# Patient Record
Sex: Female | Born: 1990 | Race: White | Hispanic: No | Marital: Single | Smoking: Former smoker
Health system: Southern US, Community
[De-identification: ages and names within clinical notes are randomized; demographics above are authoritative.]

## PROBLEM LIST (undated history)

## (undated) ENCOUNTER — Inpatient Hospital Stay (HOSPITAL_COMMUNITY): Payer: Self-pay

## (undated) DIAGNOSIS — F319 Bipolar disorder, unspecified: Secondary | ICD-10-CM

## (undated) DIAGNOSIS — J45909 Unspecified asthma, uncomplicated: Secondary | ICD-10-CM

## (undated) DIAGNOSIS — F329 Major depressive disorder, single episode, unspecified: Secondary | ICD-10-CM

## (undated) DIAGNOSIS — B999 Unspecified infectious disease: Secondary | ICD-10-CM

## (undated) DIAGNOSIS — Z8759 Personal history of other complications of pregnancy, childbirth and the puerperium: Secondary | ICD-10-CM

## (undated) DIAGNOSIS — F419 Anxiety disorder, unspecified: Secondary | ICD-10-CM

## (undated) DIAGNOSIS — F32A Depression, unspecified: Secondary | ICD-10-CM

## (undated) DIAGNOSIS — O139 Gestational [pregnancy-induced] hypertension without significant proteinuria, unspecified trimester: Secondary | ICD-10-CM

## (undated) HISTORY — PX: WISDOM TOOTH EXTRACTION: SHX21

## (undated) HISTORY — PX: NO PAST SURGERIES: SHX2092

## (undated) HISTORY — DX: Personal history of other complications of pregnancy, childbirth and the puerperium: Z87.59

---
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
## 2011-09-19 LAB — GC/CHLAMYDIA PROBE AMP, URINE
Chlamydia, Swab/Urine, PCR: NEGATIVE
GC Probe Amp, Urine: NEGATIVE

## 2011-09-20 LAB — URINALYSIS, ROUTINE W REFLEX MICROSCOPIC
Bilirubin Urine: NEGATIVE
Glucose, UA: NEGATIVE
Ketones, ur: NEGATIVE
Specific Gravity, Urine: 1.015
pH: 6

## 2011-09-20 LAB — URINE MICROSCOPIC-ADD ON

## 2011-09-29 LAB — GC/CHLAMYDIA PROBE AMP, URINE
Chlamydia, Swab/Urine, PCR: NEGATIVE
GC Probe Amp, Urine: NEGATIVE

## 2011-09-29 LAB — TSH: TSH: 0.807

## 2011-09-29 LAB — T4, FREE: Free T4: 1.24

## 2017-05-26 ENCOUNTER — Encounter: Payer: Self-pay | Admitting: Emergency Medicine

## 2017-05-26 ENCOUNTER — Emergency Department (HOSPITAL_COMMUNITY): Payer: Medicaid Other

## 2017-05-26 ENCOUNTER — Emergency Department (HOSPITAL_COMMUNITY)
Admission: EM | Admit: 2017-05-26 | Discharge: 2017-05-26 | Disposition: A | Discharge status: Home / Self Care | Payer: Medicaid Other | Attending: Emergency Medicine | Admitting: Emergency Medicine

## 2017-05-26 DIAGNOSIS — J029 Acute pharyngitis, unspecified: Secondary | ICD-10-CM | POA: Diagnosis present

## 2017-05-26 DIAGNOSIS — J020 Streptococcal pharyngitis: Secondary | ICD-10-CM | POA: Diagnosis not present

## 2017-05-26 LAB — RAPID STREP SCREEN (MED CTR MEBANE ONLY): Streptococcus, Group A Screen (Direct): POSITIVE — AB

## 2017-05-26 MED ORDER — DEXAMETHASONE 4 MG PO TABS
10.0000 mg | ORAL_TABLET | Freq: Once | ORAL | Status: AC
  Administered 2017-05-26: 10 mg via ORAL
  Filled 2017-05-26: qty 3

## 2017-05-26 MED ORDER — SODIUM CHLORIDE 0.9 % IV BOLUS (SEPSIS)
1000.0000 mL | Freq: Once | INTRAVENOUS | Status: AC
  Administered 2017-05-26: 1000 mL via INTRAVENOUS

## 2017-05-26 MED ORDER — AMOXICILLIN-POT CLAVULANATE 875-125 MG PO TABS
1.0000 | ORAL_TABLET | Freq: Once | ORAL | Status: AC
  Administered 2017-05-26: 1 via ORAL
  Filled 2017-05-26: qty 1

## 2017-05-26 MED ORDER — IOPAMIDOL (ISOVUE-300) INJECTION 61%
INTRAVENOUS | Status: AC
  Administered 2017-05-26: 75 mL via INTRAVENOUS
  Filled 2017-05-26: qty 75

## 2017-05-26 MED ORDER — AMOXICILLIN-POT CLAVULANATE 875-125 MG PO TABS
1.0000 | ORAL_TABLET | Freq: Two times a day (BID) | ORAL | 0 refills | Status: DC
Start: 2017-05-26 — End: 2017-06-07

## 2017-05-26 NOTE — ED Notes (Signed)
EDP at bedside  

## 2017-05-26 NOTE — Discharge Instructions (Signed)
Medications: Augmentin  Treatment: Take Augmentin twice daily for 10 days. Make sure to finish all of this medication. Try to stay hydrated as best he can. You can gargle with warm salt water. You can take Tylenol as prescribed over-the-counter as needed for your pain.  Follow-up: Please return to the emergency department if you develop any new or worsening symptoms such as inability to open your mouth, asymmetry in your throat, palpable masses in her neck, or any other new or concerning symptoms.

## 2017-05-26 NOTE — ED Provider Notes (Signed)
[facility]-EMERGENCY DEPT Provider Note   CSN: [id number] Arrival date & time: 05/26/17  [id number]     History   Chief Complaint Chief Complaint  Patient presents with  . Sore Throat    HPI Megan LeatherwoodJennifer Whitehead is a 26 y.o. female who presents with a three-day history of sore throat and fever. Patient reports she has not been able to eat much or drink because it hurts too much. She endorses a voice change. She has had gagging and vomiting when she does try to eat or drink. She took Tylenol at 6:30 AM this morning, but no other medications taken at home. She denies any cough, nasal congestion, ear pain, chest pain, shortness of breath, abdominal pain, urinary symptoms.  HPI  No past medical history on file.  There are no active problems to display for this patient.   No past surgical history on file.  OB History    No data available       Home Medications    Prior to Admission medications   Medication Sig Start Date End Date Taking? Authorizing Provider  fluconazole (DIFLUCAN) 150 MG tablet Take 1 tablet by mouth daily. 05/23/17  Yes [provider]  amoxicillin-clavulanate (AUGMENTIN) 875-125 MG tablet Take 1 tablet by mouth every 12 (twelve) hours. 05/26/17   Emi HolesLaw, Tymothy Cass M, PA-C    Family History No family history on file.  Social History Social History  Substance Use Topics  . Smoking status: Not on file  . Smokeless tobacco: Not on file  . Alcohol use Not on file     Allergies   Ibuprofen   Review of Systems Review of Systems  Constitutional: Positive for fever. Negative for chills.  HENT: Positive for sore throat and trouble swallowing. Negative for ear pain and facial swelling.   Respiratory: Negative for shortness of breath.   Cardiovascular: Negative for chest pain.  Gastrointestinal: Negative for abdominal pain, nausea and vomiting.  Genitourinary: Negative for dysuria.  Musculoskeletal: Negative for back pain.  Skin: Negative for rash and wound.    Neurological: Negative for headaches.  Psychiatric/Behavioral: The patient is not nervous/anxious.      Physical Exam Updated Vital Signs BP 117/75   Pulse 99   Temp 99.5 F (37.5 C) (Oral)   Resp 17   Ht 5\' 3"  (1.6 m)   Wt 72.6 kg (160 lb)   LMP 04/15/2017   SpO2 97%   BMI 28.34 kg/m   Physical Exam  Constitutional: She appears well-developed and well-nourished. No distress.  Voice change  HENT:  Head: Normocephalic and atraumatic.  Mouth/Throat: No trismus in the jaw. Uvula swelling present. Posterior oropharyngeal edema, posterior oropharyngeal erythema and tonsillar abscesses present. Tonsils are 3+ on the right. Tonsils are 3+ on the left.  Posterior oropharynx is symmetrical, erythematous, erythematous  Eyes: Conjunctivae are normal. Pupils are equal, round, and reactive to light. Right eye exhibits no discharge. Left eye exhibits no discharge. No scleral icterus.  Neck: Normal range of motion. Neck supple. No thyromegaly present.    No masses palpated to anterior neck  Cardiovascular: Normal rate, regular rhythm, normal heart sounds and intact distal pulses.  Exam reveals no gallop and no friction rub.   No murmur heard. Pulmonary/Chest: Effort normal and breath sounds normal. No stridor. No respiratory distress. She has no wheezes. She has no rales.  Abdominal: Soft. Bowel sounds are normal. She exhibits no distension. There is no tenderness. There is no rebound and no guarding.  Musculoskeletal: She exhibits no  edema.  Lymphadenopathy:    She has no cervical adenopathy.  Neurological: She is alert. Coordination normal.  Skin: Skin is warm and dry. No rash noted. She is not diaphoretic. No pallor.  Psychiatric: She has a normal mood and affect.  Nursing note and vitals reviewed.    ED Treatments / Results  Labs (all labs ordered are listed, but only abnormal results are displayed) Labs Reviewed  RAPID STREP SCREEN (NOT AT [facility]) - Abnormal; Notable for the  following:       Result Value   Streptococcus, Group A Screen (Direct) POSITIVE (*)    All other components within normal limits    EKG  EKG Interpretation None       Radiology Ct Soft Tissue Neck W Contrast  Result Date: 05/26/2017 CLINICAL DATA:  Initial evaluation for acute sore throat for 3 days, voice changes, concern for peritonsillar abscess. EXAM: CT NECK WITH CONTRAST TECHNIQUE: Multidetector CT imaging of the neck was performed using the standard protocol following the bolus administration of intravenous contrast. CONTRAST:  <See Chart> ISOVUE-300 IOPAMIDOL (ISOVUE-300) INJECTION 61% COMPARISON:  None. FINDINGS: Pharynx and larynx: Oral cavity within normal limits without mass lesion or loculated fluid collection. No acute abnormality about the dentition. Palatine tonsils are markedly enlarged and hyperenhancing, consistent with acute tonsillitis. Similar changes within the lingual tonsils and adenoidal soft tissues. No discrete tonsillar or peritonsillar abscess. Remainder of the oropharynx within normal limits. Parapharyngeal fat preserved. Nasopharynx within normal limits. Epiglottis normal. Vallecula clear. Retropharyngeal soft tissues normal. Remainder of the hypopharynx and supraglottic larynx normal. True cords symmetric and normal. Subglottic airway clear. Salivary glands: Salivary glands including the parotid and submandibular glands are normal. Thyroid: Thyroid normal. Lymph nodes: Enlarged bilateral level II adenopathy, measuring up to 2.2 cm on the right and 1.6 cm on the left, likely reactive. Prominent 7 mm left retropharyngeal lymph node, also reactive. Vascular: Normal intravascular enhancement seen throughout the neck. Limited intracranial: Unremarkable. Visualized orbits: Visualized globes and orbital soft tissues within normal limits. Mastoids and visualized paranasal sinuses: Scattered mucosal thickening within the ethmoidal air cells. Retention cyst present within the  bilateral maxillary sinuses. Bilateral concha bullosa noted. Trace right mastoid effusion. Left mastoids clear. Middle ear cavities are clear. Skeleton: No acute osseous abnormality. No worrisome lytic or blastic osseous lesions. Upper chest: The visualized upper chest within normal limits. Visualized lungs are clear. IMPRESSION: 1. Findings consistent with acute tonsillitis. No discrete tonsillar or peritonsillar abscess identified. 2. Enlarged bilateral level II adenopathy, likely reactive. Electronically Signed   By: Rise Mu M.D.   On: 05/26/2017 12:34    Procedures Procedures (including critical care time)  Medications Ordered in ED Medications  sodium chloride 0.9 % bolus 1,000 mL (0 mLs Intravenous Stopped 05/26/17 1250)  iopamidol (ISOVUE-300) 61 % injection (75 mLs Intravenous Contrast Given 05/26/17 1205)  dexamethasone (DECADRON) tablet 10 mg (10 mg Oral Given 05/26/17 1254)  amoxicillin-clavulanate (AUGMENTIN) 875-125 MG per tablet 1 tablet (1 tablet Oral Given 05/26/17 1254)     Initial Impression / Assessment and Plan / ED Course  I have reviewed the triage vital signs and the nursing notes.  Pertinent labs & imaging results that were available during my care of the patient were reviewed by me and considered in my medical decision making (see chart for details).     Patient with positive rapid strep screen. CT soft tissue of the neck was ordered because patient's voice change and concern for peritonsillar abscess. No tonsillar or  peritonsillar abscess identified; findings consistent with acute tonsillitis. Patient also has enlarged bilateral level II adenopathy, likely reactive. Will treat patient with Augmentin. Patient given dexamethasone 10 mg in the ED. Patient tolerating fluids prior to discharge Strict return precautions discussed. Patient understands and agrees with plan. Patient's tachycardia improved with fluids in the ED. Suspect dehydration as cause. Patient  vitals stable and discharged in satisfactory condition. Patient also evaluated by Dr. Madilyn Hook who guided the patient's management and agrees with plan.  Final Clinical Impressions(s) / ED Diagnoses   Final diagnoses:  Strep throat    New Prescriptions Discharge Medication List as of 05/26/2017  1:30 PM    START taking these medications   Details  amoxicillin-clavulanate (AUGMENTIN) 875-125 MG tablet Take 1 tablet by mouth every 12 (twelve) hours., Starting Fri 05/26/2017, Print         Mayleigh Tetrault, [place], PA-C 05/26/17 1501    Tilden Fossa, MD 05/27/17 1029

## 2017-05-26 NOTE — ED Notes (Signed)
Patient transported to CT 

## 2017-05-26 NOTE — ED Notes (Signed)
Pts acuity made a "3" due to HR of 130

## 2017-05-26 NOTE — ED Triage Notes (Addendum)
Pt reports sore throat and states she feels lumps in her neck, onset 3 days ago. She reports pain with swallowing.  Pt states she took 1g of Tylenol this morning at 6:30am this morning.

## 2017-06-07 ENCOUNTER — Encounter (HOSPITAL_COMMUNITY): Payer: Self-pay | Admitting: Neurology

## 2017-06-07 ENCOUNTER — Emergency Department (HOSPITAL_COMMUNITY)
Admission: EM | Admit: 2017-06-07 | Discharge: 2017-06-07 | Disposition: A | Discharge status: Home / Self Care | Payer: Medicaid Other | Attending: Emergency Medicine | Admitting: Emergency Medicine

## 2017-06-07 ENCOUNTER — Emergency Department (HOSPITAL_COMMUNITY): Payer: Medicaid Other

## 2017-06-07 DIAGNOSIS — Z79899 Other long term (current) drug therapy: Secondary | ICD-10-CM | POA: Diagnosis not present

## 2017-06-07 DIAGNOSIS — R079 Chest pain, unspecified: Secondary | ICD-10-CM | POA: Insufficient documentation

## 2017-06-07 DIAGNOSIS — R002 Palpitations: Secondary | ICD-10-CM | POA: Diagnosis present

## 2017-06-07 LAB — BASIC METABOLIC PANEL
Anion gap: 8 (ref 5–15)
BUN: 6 mg/dL (ref 6–20)
CO2: 20 mmol/L — ABNORMAL LOW (ref 22–32)
Calcium: 8.9 mg/dL (ref 8.9–10.3)
Chloride: 108 mmol/L (ref 101–111)
Creatinine, Ser: 0.76 mg/dL (ref 0.44–1.00)
GFR calc Af Amer: >60 mL/min (ref >60)
GFR calc non Af Amer: >60 mL/min (ref >60)
Glucose, Bld: 94 mg/dL (ref 65–99)
Potassium: 3.9 mmol/L (ref 3.5–5.1)
Sodium: 136 mmol/L (ref 135–145)

## 2017-06-07 LAB — I-STAT TROPONIN, ED: Troponin i, poc: 0.01 ng/mL (ref 0.00–0.08)

## 2017-06-07 LAB — CBC
HCT: 42 % (ref 36.0–46.0)
Hemoglobin: 14.4 g/dL (ref 12.0–15.0)
MCH: 30.1 pg (ref 26.0–34.0)
MCHC: 34.3 g/dL (ref 30.0–36.0)
MCV: 87.9 fL (ref 78.0–100.0)
Platelets: 274 10*3/uL (ref 150–400)
RBC: 4.78 MIL/uL (ref 3.87–5.11)
RDW: 12.6 % (ref 11.5–15.5)
WBC: 11.4 10*3/uL — ABNORMAL HIGH (ref 4.0–10.5)

## 2017-06-07 LAB — I-STAT BETA HCG BLOOD, ED (MC, WL, AP ONLY): I-stat hCG, quantitative: <5 m[IU]/mL (ref <5)

## 2017-06-07 LAB — D-DIMER, QUANTITATIVE: D-Dimer, Quant: 0.61 ug/mL-FEU — ABNORMAL HIGH (ref 0.00–0.50)

## 2017-06-07 MED ORDER — IOPAMIDOL (ISOVUE-370) INJECTION 76%
INTRAVENOUS | Status: AC
  Administered 2017-06-07: 100 mL
  Filled 2017-06-07: qty 100

## 2017-06-07 MED ORDER — SODIUM CHLORIDE 0.9 % IV BOLUS (SEPSIS)
1000.0000 mL | Freq: Once | INTRAVENOUS | Status: AC
  Administered 2017-06-07: 1000 mL via INTRAVENOUS

## 2017-06-07 NOTE — ED Triage Notes (Signed)
Pt here reporting left sided CP x 3 days. Feels sob, nausea. Also could be pregnant, LMP 05/16/17. Is a x 4. In NAD

## 2017-06-07 NOTE — ED Notes (Signed)
Pt taken to XRay 

## 2017-06-07 NOTE — ED Notes (Signed)
Pt taken to CT.

## 2017-06-07 NOTE — ED Notes (Signed)
PT refusing gown

## 2017-06-07 NOTE — ED Provider Notes (Signed)
[facility]Y DEPT Provider Note    By signing my name below, I, Earmon Phoenix, attest that this documentation has been prepared under the direction and in the presence of Raeford Razor, MD. Electronically Signed: Earmon Phoenix, ED Scribe. 06/07/17. 12:59 PM.    History   Chief Complaint Chief Complaint  Patient presents with  . Chest Pain    The history is provided by the patient and medical records. No language interpreter was used.    [name] is a 26 y.o. female who presents to the Emergency Department complaining of non radiating CP that began three days ago. She reports associated cough and palpitations. She has not taken anything for pain. Moving her upper extremities, changing positions and bending increase the pain. She denies alleviating factors. She denies SOB, nausea, vomiting. She denies any new or different heavy lifting, trauma, injury or fall. She denies h/o DVT or PE. She is on oral contraceptive pills. She denies any recent travel or surgeries.   History reviewed. No pertinent past medical history.  There are no active problems to display for this patient.   History reviewed. No pertinent surgical history.  OB History    No data available       Home Medications    Prior to Admission medications   Medication Sig Start Date End Date Taking? Authorizing Provider  ESTARYLLA 0.25-35 MG-MCG tablet Take 1 tablet by mouth daily. 05/23/17  Yes [provider]    Family History No family history on file.  Social History Social History  Substance Use Topics  . Smoking status: Never Smoker  . Smokeless tobacco: Not on file  . Alcohol use No     Allergies   Sulfa antibiotics and Ibuprofen   Review of Systems Review of Systems All other systems reviewed and are negative for acute change except as noted in the HPI.   Physical Exam Updated Vital Signs BP 113/72 (BP Location: Left Arm)   Pulse 93   Temp 98.2 F (36.8 C)  (Oral)   Resp 16   Ht 5\' 3"  (1.6 m)   Wt 160 lb (72.6 kg)   LMP 05/16/2017   SpO2 97%   BMI 28.34 kg/m   Physical Exam  Constitutional: She is oriented to person, place, and time. She appears well-developed and well-nourished. No distress.  HENT:  Head: Normocephalic and atraumatic.  Eyes: EOM are normal.  Neck: Normal range of motion.  Cardiovascular: Normal rate, regular rhythm and normal heart sounds.   Pulmonary/Chest: Effort normal and breath sounds normal.  Pain not reproducible with ROM or palpation.  Abdominal: Soft. She exhibits no distension. There is no tenderness.  Musculoskeletal: Normal range of motion.  Neurological: She is alert and oriented to person, place, and time.  Skin: Skin is warm and dry.  Psychiatric: She has a normal mood and affect. Judgment normal.  Nursing note and vitals reviewed.    ED Treatments / Results  DIAGNOSTIC STUDIES: Oxygen Saturation is 97% on RA, normal by my interpretation.   COORDINATION OF CARE: 9:14 AM- Will order CXR, labs and informed pt that EKG was normal. Pt verbalizes understanding and agrees to plan.  Medications  sodium chloride 0.9 % bolus 1,000 mL (1,000 mLs Intravenous New Bag/Given 06/07/17 1115)  iopamidol (ISOVUE-370) 76 % injection (100 mLs  Contrast Given 06/07/17 1223)    Labs (all labs ordered are listed, but only abnormal results are displayed) Labs Reviewed  BASIC METABOLIC PANEL - Abnormal; Notable for the following:  Result Value   CO2 20 (*)    All other components within normal limits  CBC - Abnormal; Notable for the following:    WBC 11.4 (*)    All other components within normal limits  D-DIMER, QUANTITATIVE (NOT AT [facility]) - Abnormal; Notable for the following:    D-Dimer, Quant 0.61 (*)    All other components within normal limits  I-STAT TROPOININ, ED  I-STAT BETA HCG BLOOD, ED ([facility], [facility], [facility] ONLY)    EKG  EKG Interpretation  Date/Time:  Wednesday June 07 2017 08:56:11  EDT Ventricular Rate:  106 PR Interval:  112 QRS Duration: 84 QT Interval:  334 QTC Calculation: 443 R Axis:   77 Text Interpretation:  Sinus tachycardia Otherwise normal ECG Confirmed by Juleen ChinaKOHUT  MD, Iokepa Geffre [phone]([id number]) on 06/07/2017 11:03:16 AM       Radiology Dg Chest 2 View  Result Date: 06/07/2017 CLINICAL DATA:  Onset of LEFT-sided chest pain 3 days ago, getting worse, cough EXAM: CHEST  2 VIEW COMPARISON:  09/25/2016 FINDINGS: Normal heart size, mediastinal contours, and pulmonary vascularity. Lungs clear. No pleural effusion or pneumothorax. Bones unremarkable. IMPRESSION: No acute abnormalities. Electronically Signed   By: Ulyses SouthwardMark  Boles M.D.   On: 06/07/2017 09:36   Ct Angio Chest Pe W And/or Wo Contrast  Result Date: 06/07/2017 CLINICAL DATA:  Chest pain for 3 days EXAM: CT ANGIOGRAPHY CHEST WITH CONTRAST TECHNIQUE: Multidetector CT imaging of the chest was performed using the standard protocol during bolus administration of intravenous contrast. Multiplanar CT image reconstructions and MIPs were obtained to evaluate the vascular anatomy. CONTRAST:  100 mL Isovue 370. COMPARISON:  None. FINDINGS: Cardiovascular: The thoracic aorta is within normal limits. The pulmonary artery shows a normal branching pattern without intraluminal filling defect to suggest pulmonary embolism. No significant cardiac enlargement is noted. No coronary calcifications are seen. Mediastinum/Nodes: The thoracic inlet is within normal limits. No hilar or mediastinal adenopathy is identified. The esophagus as visualized is within normal limits. Lungs/Pleura: Lungs are well aerated bilaterally. A tiny nodule is noted in the right middle lobe best seen on image number 47 of series 7. It measures approximately 5 mm in greatest dimension. No other nodules are noted. No focal infiltrate or sizable effusion is seen. Upper Abdomen: No acute upper abdominal abnormality is noted. Musculoskeletal: No acute bony abnormality is seen.  Review of the MIP images confirms the above findings. IMPRESSION: No evidence of pulmonary emboli. Tiny 5 mm nodule in the right middle lobe. No follow-up needed if patient is low-risk. Non-contrast chest CT can be considered in 12 months if patient is high-risk. This recommendation follows the consensus statement: Guidelines for Management of Incidental Pulmonary Nodules Detected on CT Images: From the Fleischner Society 2017; Radiology 2017; 284:228-243. Electronically Signed   By: Alcide CleverMark  Lukens M.D.   On: 06/07/2017 12:44    Procedures Procedures (including critical care time)  Medications Ordered in ED Medications  sodium chloride 0.9 % bolus 1,000 mL (1,000 mLs Intravenous New Bag/Given 06/07/17 1115)  iopamidol (ISOVUE-370) 76 % injection (100 mLs  Contrast Given 06/07/17 1223)     Initial Impression / Assessment and Plan / ED Course  I have reviewed the triage vital signs and the nursing notes.  Pertinent labs & imaging results that were available during my care of the patient were reviewed by me and considered in my medical decision making (see chart for details).     25yf with CP. Doubt acs, pe, dissection or other emergent process.  Final Clinical Impressions(s) / ED Diagnoses   Final diagnoses:  Chest pain, unspecified type    New Prescriptions New Prescriptions   No medications on file    I personally preformed the services scribed in my presence. The recorded information has been reviewed is accurate. Raeford Razor, MD.     Raeford Razor, MD 06/17/17 [phone]

## 2017-06-07 NOTE — ED Notes (Signed)
Registration at the bedside.

## 2017-06-07 NOTE — ED Notes (Signed)
Pt returned from CT °

## 2017-08-02 ENCOUNTER — Emergency Department (HOSPITAL_COMMUNITY)
Admission: EM | Admit: 2017-08-02 | Discharge: 2017-08-02 | Disposition: A | Discharge status: Home / Self Care | Payer: Medicaid Other | Attending: Emergency Medicine | Admitting: Emergency Medicine

## 2017-08-02 ENCOUNTER — Encounter (HOSPITAL_COMMUNITY): Payer: Self-pay

## 2017-08-02 DIAGNOSIS — N898 Other specified noninflammatory disorders of vagina: Secondary | ICD-10-CM | POA: Insufficient documentation

## 2017-08-02 DIAGNOSIS — Z5321 Procedure and treatment not carried out due to patient leaving prior to being seen by health care provider: Secondary | ICD-10-CM | POA: Insufficient documentation

## 2017-08-02 HISTORY — DX: Unspecified asthma, uncomplicated: J45.909

## 2017-08-02 NOTE — ED Triage Notes (Signed)
Pt reports she is pregnant and had some vaginal irritation after intercourse and is concerned she may have a yeast infection but denies any irritation, discharge, odor. PT also endorses nausea with pregnancy. Unsure how far along she is. Has not seen ob.

## 2017-08-02 NOTE — ED Notes (Signed)
Pt name called x3 with no response. 

## 2017-08-02 NOTE — ED Triage Notes (Signed)
This Clinical research associate called Pt name in front lobby. Unable to locate Pt.

## 2017-08-03 ENCOUNTER — Encounter (HOSPITAL_COMMUNITY): Payer: Self-pay | Admitting: *Deleted

## 2017-08-03 ENCOUNTER — Inpatient Hospital Stay (HOSPITAL_COMMUNITY)
Admission: AD | Admit: 2017-08-03 | Discharge: 2017-08-03 | Disposition: A | Discharge status: Home / Self Care | Payer: Medicaid Other | Source: Ambulatory Visit | Attending: Obstetrics & Gynecology | Admitting: Obstetrics & Gynecology

## 2017-08-03 DIAGNOSIS — Z3A01 Less than 8 weeks gestation of pregnancy: Secondary | ICD-10-CM | POA: Diagnosis not present

## 2017-08-03 DIAGNOSIS — Z882 Allergy status to sulfonamides status: Secondary | ICD-10-CM | POA: Diagnosis not present

## 2017-08-03 DIAGNOSIS — O210 Mild hyperemesis gravidarum: Secondary | ICD-10-CM | POA: Diagnosis not present

## 2017-08-03 DIAGNOSIS — R55 Syncope and collapse: Secondary | ICD-10-CM | POA: Insufficient documentation

## 2017-08-03 DIAGNOSIS — Z888 Allergy status to other drugs, medicaments and biological substances status: Secondary | ICD-10-CM | POA: Insufficient documentation

## 2017-08-03 DIAGNOSIS — O219 Vomiting of pregnancy, unspecified: Secondary | ICD-10-CM | POA: Insufficient documentation

## 2017-08-03 DIAGNOSIS — O26891 Other specified pregnancy related conditions, first trimester: Secondary | ICD-10-CM | POA: Diagnosis not present

## 2017-08-03 LAB — COMPREHENSIVE METABOLIC PANEL
ALT: 15 U/L (ref 14–54)
ANION GAP: 9 (ref 5–15)
AST: 16 U/L (ref 15–41)
Albumin: 4.7 g/dL (ref 3.5–5.0)
Alkaline Phosphatase: 66 U/L (ref 38–126)
BILIRUBIN TOTAL: 1 mg/dL (ref 0.3–1.2)
BUN: 6 mg/dL (ref 6–20)
CO2: 25 mmol/L (ref 22–32)
Calcium: 9.4 mg/dL (ref 8.9–10.3)
Chloride: 103 mmol/L (ref 101–111)
Creatinine, Ser: 0.53 mg/dL (ref 0.44–1.00)
Glucose, Bld: 88 mg/dL (ref 65–99)
POTASSIUM: 3.9 mmol/L (ref 3.5–5.1)
Sodium: 137 mmol/L (ref 135–145)
TOTAL PROTEIN: 8.4 g/dL — AB (ref 6.5–8.1)

## 2017-08-03 LAB — URINALYSIS, ROUTINE W REFLEX MICROSCOPIC
BILIRUBIN URINE: NEGATIVE
GLUCOSE, UA: NEGATIVE mg/dL
HGB URINE DIPSTICK: NEGATIVE
Ketones, ur: NEGATIVE mg/dL
NITRITE: NEGATIVE
Protein, ur: NEGATIVE mg/dL
SPECIFIC GRAVITY, URINE: 1.003 — AB (ref 1.005–1.030)
pH: 7 (ref 5.0–8.0)

## 2017-08-03 LAB — CBC
HCT: 42.2 % (ref 36.0–46.0)
Hemoglobin: 15 g/dL (ref 12.0–15.0)
MCH: 30.4 pg (ref 26.0–34.0)
MCHC: 35.5 g/dL (ref 30.0–36.0)
MCV: 85.4 fL (ref 78.0–100.0)
Platelets: 283 10*3/uL (ref 150–400)
RBC: 4.94 MIL/uL (ref 3.87–5.11)
RDW: 12.9 % (ref 11.5–15.5)
WBC: 9.6 10*3/uL (ref 4.0–10.5)

## 2017-08-03 LAB — POCT PREGNANCY, URINE: Preg Test, Ur: POSITIVE — AB

## 2017-08-03 MED ORDER — PROMETHAZINE HCL 12.5 MG PO TABS: 12.5000 mg | ORAL_TABLET | Freq: Four times a day (QID) | ORAL | 0 refills | Status: DC | PRN

## 2017-08-03 NOTE — MAU Provider Note (Signed)
History  Chief Complaint:  Possible Pregnancy and Fatigue  [name] is a 26 y.o. G4P3 female at [redacted]w[redacted]d presenting via EMS for syncope. She was at a grocery store and felt lightheaded and faint, so she sat down and passed out. She next remembers being handed a large bottle of water. EMS administered fluids via 20G IV and zofran. She states that she feels much better and desires discharge. Prior to the syncopal event, she reports 2 weeks of nausea/vomiting and inability to tolerate PO intake. She reports that the nausea and vomiting have been worse over the past 2 days, and she has not been drinking water and states that she has been very thirsty. She had similar nausea and vomiting in previous pregnancies with reported syncopal episodes due to dehydration.    She denies weight loss, HA, chest pain, SOB, palpitations, vaginal bleeding or discharge, urinary symptoms. She has no personal or family history of cardiac arrhythmias.  She was seen in [facility] ED for chest pain with unremarkable workup including multiple normal EKGs, CXR, CT angiogram.   Not yet established for prenatal care.  Obstetrical History: OB History    Gravida Para Term Preterm AB Living   1             SAB TAB Ectopic Multiple Live Births                  Past Medical History: Past Medical History:  Diagnosis Date  . Asthma     Past Surgical History: No past surgical history on file.  Social History: Social History   Social History  . Marital status: Single    Spouse name: N/A  . Number of children: N/A  . Years of education: N/A   Social History Main Topics  . Smoking status: Never Smoker  . Smokeless tobacco: Never Used  . Alcohol use No  . Drug use: Unknown  . Sexual activity: Not on file   Other Topics Concern  . Not on file   Social History Narrative  . No narrative on file    Allergies: Allergies  Allergen Reactions  . Sulfa Antibiotics Hives  . Ibuprofen Hives    Prescriptions  Prior to Admission  Medication Sig Dispense Refill Last Dose  . ESTARYLLA 0.25-35 MG-MCG tablet Take 1 tablet by mouth daily.  3 06/07/2017 at Unknown time    Review of Systems  Pertinent pos/neg as indicated in HPI  Physical Exam  Blood pressure 126/63, pulse 91, temperature 97.8 F (36.6 C), temperature source Oral, resp. rate 18, weight 79 kg (174 lb 1.3 oz), last menstrual period 06/12/2017, SpO2 99 %. General appearance: alert, cooperative and no distress Lungs: normal work of breathing Extremities: no edema, capillary refill <3sec, normal skin turgor  MAU Course   MDM CBC/CMP/ UPC MAU observation History and physical examination Patient verbalized agreement and understanding with the assessment and plan.  Labs:  Results for orders placed or performed during the hospital encounter of 08/03/17 (from the past 24 hour(s))  Urinalysis, Routine w reflex microscopic     Status: Abnormal   Collection Time: 08/03/17  2:50 PM  Result Value Ref Range   Color, Urine STRAW (A) YELLOW   APPearance HAZY (A) CLEAR   Specific Gravity, Urine 1.003 (L) 1.005 - 1.030   pH 7.0 5.0 - 8.0   Glucose, UA NEGATIVE NEGATIVE mg/dL   Hgb urine dipstick NEGATIVE NEGATIVE   Bilirubin Urine NEGATIVE NEGATIVE   Ketones, ur NEGATIVE NEGATIVE mg/dL  Protein, ur NEGATIVE NEGATIVE mg/dL   Nitrite NEGATIVE NEGATIVE   Leukocytes, UA LARGE (A) NEGATIVE   RBC / HPF 0-5 0 - 5 RBC/hpf   WBC, UA 0-5 0 - 5 WBC/hpf   Bacteria, UA RARE (A) NONE SEEN   Squamous Epithelial / LPF 6-30 (A) NONE SEEN   Amorphous Crystal PRESENT   Pregnancy, urine POC     Status: Abnormal   Collection Time: 08/03/17  3:36 PM  Result Value Ref Range   Preg Test, Ur POSITIVE (A) NEGATIVE  CBC     Status: None   Collection Time: 08/03/17  5:06 PM  Result Value Ref Range   WBC 9.6 4.0 - 10.5 K/uL   RBC 4.94 3.87 - 5.11 MIL/uL   Hemoglobin 15.0 12.0 - 15.0 g/dL   HCT 03.8 88.2 - 80.0 %   MCV 85.4 78.0 - 100.0 fL   MCH 30.4 26.0  - 34.0 pg   MCHC 35.5 30.0 - 36.0 g/dL   RDW 34.9 17.9 - 15.0 %   Platelets 283 150 - 400 K/uL    Imaging:  None Assessment and Plan   A: [name] is a 26yo G4P3 who presents post-syncopal episode. Her syncope was likely due to dehydration secondary to nausea and vomiting of pregnancy resulting in poor PO intake and fluid loss. CMP and CBC wnl, vital signs and physical exam reassuring for appropriate hydration status. Previous cardiac workup on 6/28 at [facility] reassuring.    P: Appropriate for discharge home.   Syncopal Episode: Patient advised to maintain adequate hydration with water or Pedialyte. Strict return precautions given regarding new or worsening symptoms. Pt instructed to establish with PCP for ongoing care.   Nausea and Vomiting of Pregnancy: Prescribed 12.5mg  PO phenergan. Pt instructed on appropriate diet. Return precautions given regarding new or worsening symptoms.   Patient advised to take prenatal vitamin daily. Patient to establish care with [facility] at [facility] office.   Dannette Barbara, Medical Student 8/23/20185:32 PM  Patient feels much better after IV hydration and 4 mg of zofran given by EMS. CBC and CMP normal; UA shows slight dehydration. Patient stable for discharge.   I confirm that I have verified the information documented in the student's note and that I have also personally reperformed the physical exam and all medical decision making activities. Luna Kitchens CNM

## 2017-08-03 NOTE — Discharge Instructions (Signed)

## 2017-08-03 NOTE — MAU Note (Signed)
Urine in lab 

## 2017-08-03 NOTE — MAU Note (Addendum)
Arrived via EMS. 20G IV noted saline locked in L AC Patient went to grocery story and felt faint. States sat down and states that the next thing she remembers is someone handing her a bottle of water.  +nausea--a little better following zofran in the ems States feels dehydrated  +HPT LMP 7/2

## 2017-08-11 ENCOUNTER — Encounter: Payer: Self-pay | Admitting: Obstetrics & Gynecology

## 2017-08-11 ENCOUNTER — Other Ambulatory Visit (HOSPITAL_COMMUNITY)
Admission: RE | Admit: 2017-08-11 | Discharge: 2017-08-11 | Disposition: A | Discharge status: Home / Self Care | Payer: Medicaid Other | Source: Ambulatory Visit | Attending: Obstetrics & Gynecology | Admitting: Obstetrics & Gynecology

## 2017-08-11 ENCOUNTER — Ambulatory Visit (INDEPENDENT_AMBULATORY_CARE_PROVIDER_SITE_OTHER): Payer: Medicaid Other | Admitting: Obstetrics & Gynecology

## 2017-08-11 VITALS — BP 111/68 | HR 102 | Wt 176.8 lb

## 2017-08-11 DIAGNOSIS — Z3482 Encounter for supervision of other normal pregnancy, second trimester: Secondary | ICD-10-CM

## 2017-08-11 DIAGNOSIS — Z3480 Encounter for supervision of other normal pregnancy, unspecified trimester: Secondary | ICD-10-CM | POA: Diagnosis present

## 2017-08-11 DIAGNOSIS — Z3481 Encounter for supervision of other normal pregnancy, first trimester: Secondary | ICD-10-CM | POA: Insufficient documentation

## 2017-08-11 DIAGNOSIS — O09292 Supervision of pregnancy with other poor reproductive or obstetric history, second trimester: Secondary | ICD-10-CM

## 2017-08-11 DIAGNOSIS — B9689 Other specified bacterial agents as the cause of diseases classified elsewhere: Secondary | ICD-10-CM

## 2017-08-11 DIAGNOSIS — O09299 Supervision of pregnancy with other poor reproductive or obstetric history, unspecified trimester: Secondary | ICD-10-CM

## 2017-08-11 DIAGNOSIS — N760 Acute vaginitis: Secondary | ICD-10-CM

## 2017-08-11 NOTE — Progress Notes (Signed)
Subjective:   [name] is a 26 y.o. [phone] at 31w4dby LMP, early ultrasound in office today being seen today for her first obstetrical visit.  Her obstetrical history is significant for pre-eclampsia during all her pregnancies; normotensive when not pregnant.  Patient does not intend to breast feed. Pregnancy history fully reviewed.  Patient reports no complaints.  HISTORY: Obstetric History   G4   P3   T3   P0   A0   L3    SAB0   TAB0   Ectopic0   Multiple0   Live Births3     # Outcome Date GA Lbr Len/2nd Weight Sex Delivery Anes PTL Lv  4 Current           3 Term 03/13/15 439w0d8 lb 1 oz (3.657 kg) M Vag-Spont EPI  LIV  2 Term 07/04/12 4128w0d lb 11 oz (3.033 kg) F Vag-Spont EPI  LIV  1 Term 10/26/10 38w6w0dlb 7 oz (3.374 kg) F Vag-Spont EPI  LIV     Past Medical History:  Diagnosis Date  . Asthma    History reviewed. No pertinent surgical history. Family History  Problem Relation Age of Onset  . Hypertension Mother   . Hypertension Father    Social History  Substance Use Topics  . Smoking status: Never Smoker  . Smokeless tobacco: Never Used  . Alcohol use No   Allergies  Allergen Reactions  . Sulfa Antibiotics Hives  . Ibuprofen Hives   Current Outpatient Prescriptions on File Prior to Visit  Medication Sig Dispense Refill  . promethazine (PHENERGAN) 12.5 MG tablet Take 1 tablet (12.5 mg total) by mouth every 6 (six) hours as needed for nausea or vomiting. 30 tablet 0   No current facility-administered medications on file prior to visit.      Exam   Vitals:   08/11/17 0938  BP: 111/68  Pulse: (!) 102  Weight: 176 lb 12.8 oz (80.2 kg)   Fetal Heart Rate (bpm): + on u/s  Uterus:     Pelvic Exam: Perineum: no hemorrhoids, normal perineum   Vulva: normal external genitalia, no lesions   Vagina:  normal mucosa, normal discharge   Cervix: no lesions and normal, pap smear done.    Adnexa: normal adnexa and no mass, fullness, tenderness   Bony Pelvis: average  System: General: well-developed, well-nourished female in no acute distress   Breast:  normal appearance, no masses or tenderness   Skin: normal coloration and turgor, no rashes   Neurologic: oriented, normal, negative, normal mood   Extremities: normal strength, tone, and muscle mass, ROM of all joints is normal   HEENT PERRLA, extraocular movement intact and sclera clear, anicteric   Mouth/Teeth mucous membranes moist, pharynx normal without lesions and dental hygiene good   Neck supple and no masses   Cardiovascular: regular rate and rhythm   Respiratory:  no respiratory distress, normal breath sounds   Abdomen: soft, non-tender; bowel sounds normal; no masses,  no organomegaly     Assessment:   Pregnancy: G4P3P1P2162ient Active Problem List   Diagnosis Date Noted  . Supervision of other normal pregnancy, antepartum 08/11/2017  . History of pre-eclampsia in prior pregnancy, currently pregnant 08/11/2017     Plan:  1. Supervision of other normal pregnancy, antepartum Initial labs drawn. Continue prenatal vitamins. Genetic Screening discussed, First trimester screen: ordered. Ultrasound discussed; fetal anatomic survey: to be ordered later. - Culture, OB Urine - Cystic  Fibrosis Mutation 97 - Obstetric Panel, Including HIV - Hemoglobinopathy evaluation - Cytology - PAP - [place] bedside - SMN1 COPY NUMBER ANALYSIS (SMA Carrier Screen) - [place] MFM Fetal Nuchal Translucency; Future  2. History of pre-eclampsia in prior pregnancy, currently pregnant Baseline labs ordered. Unable to be on ASA due to allergy. Will monitor BP. - Comp Met (CMET) - Protein / creatinine ratio, urine  Problem list reviewed and updated. The nature of [place] with multiple MDs and other Advanced Practice Providers was explained to patient; also emphasized that residents, students are part of our team. Routine obstetric precautions  reviewed. Return in about 4 weeks (around 09/08/2017) for OB Visit.     Verita Schneiders, MD, [place] Attending [place], [facility] for [employer], [place]

## 2017-08-11 NOTE — Patient Instructions (Signed)
First Trimester of Pregnancy The first trimester of pregnancy is from week 1 until the end of week 13 (months 1 through 3). A week after a sperm fertilizes an egg, the egg will implant on the wall of the uterus. This embryo will begin to develop into a baby. Genes from you and your partner will form the baby. The female genes will determine whether the baby will be a boy or a girl. At 6-8 weeks, the eyes and face will be formed, and the heartbeat can be seen on ultrasound. At the end of 12 weeks, all the baby's organs will be formed. Now that you are pregnant, you will want to do everything you can to have a healthy baby. Two of the most important things are to get good prenatal care and to follow your health care provider's instructions. Prenatal care is all the medical care you receive before the baby's birth. This care will help prevent, find, and treat any problems during the pregnancy and childbirth. Body changes during your first trimester Your body goes through many changes during pregnancy. The changes vary from woman to woman.  You may gain or lose a couple of pounds at first.  You may feel sick to your stomach (nauseous) and you may throw up (vomit). If the vomiting is uncontrollable, call your health care provider.  You may tire easily.  You may develop headaches that can be relieved by medicines. All medicines should be approved by your health care provider.  You may urinate more often. Painful urination may mean you have a bladder infection.  You may develop heartburn as a result of your pregnancy.  You may develop constipation because certain hormones are causing the muscles that push stool through your intestines to slow down.  You may develop hemorrhoids or swollen veins (varicose veins).  Your breasts may begin to grow larger and become tender. Your nipples may stick out more, and the tissue that surrounds them (areola) may become darker.  Your gums may bleed and may be  sensitive to brushing and flossing.  Dark spots or blotches (chloasma, mask of pregnancy) may develop on your face. This will likely fade after the baby is born.  Your menstrual periods will stop.  You may have a loss of appetite.  You may develop cravings for certain kinds of food.  You may have changes in your emotions from day to day, such as being excited to be pregnant or being concerned that something may go wrong with the pregnancy and baby.  You may have more vivid and strange dreams.  You may have changes in your hair. These can include thickening of your hair, rapid growth, and changes in texture. Some women also have hair loss during or after pregnancy, or hair that feels dry or thin. Your hair will most likely return to normal after your baby is born.  What to expect at prenatal visits During a routine prenatal visit:  You will be weighed to make sure you and the baby are growing normally.  Your blood pressure will be taken.  Your abdomen will be measured to track your baby's growth.  The fetal heartbeat will be listened to between weeks 10 and 14 of your pregnancy.  Test results from any previous visits will be discussed.  Your health care provider may ask you:  How you are feeling.  If you are feeling the baby move.  If you have had any abnormal symptoms, such as leaking fluid, bleeding, severe headaches,   or abdominal cramping.  If you are using any tobacco products, including cigarettes, chewing tobacco, and electronic cigarettes.  If you have any questions.  Other tests that may be performed during your first trimester include:  Blood tests to find your blood type and to check for the presence of any previous infections. The tests will also be used to check for low iron levels (anemia) and protein on red blood cells (Rh antibodies). Depending on your risk factors, or if you previously had diabetes during pregnancy, you may have tests to check for high blood  sugar that affects pregnant women (gestational diabetes).  Urine tests to check for infections, diabetes, or protein in the urine.  An ultrasound to confirm the proper growth and development of the baby.  Fetal screens for spinal cord problems (spina bifida) and Down syndrome.  HIV (human immunodeficiency virus) testing. Routine prenatal testing includes screening for HIV, unless you choose not to have this test.  You may need other tests to make sure you and the baby are doing well.  Follow these instructions at home: Medicines  Follow your health care provider's instructions regarding medicine use. Specific medicines may be either safe or unsafe to take during pregnancy.  Take a prenatal vitamin that contains at least 600 micrograms (mcg) of folic acid.  If you develop constipation, try taking a stool softener if your health care provider approves. Eating and drinking  Eat a balanced diet that includes fresh fruits and vegetables, whole grains, good sources of protein such as meat, eggs, or tofu, and low-fat dairy. Your health care provider will help you determine the amount of weight gain that is right for you.  Avoid raw meat and uncooked cheese. These carry germs that can cause birth defects in the baby.  Eating four or five small meals rather than three large meals a day may help relieve nausea and vomiting. If you start to feel nauseous, eating a few soda crackers can be helpful. Drinking liquids between meals, instead of during meals, also seems to help ease nausea and vomiting.  Limit foods that are high in fat and processed sugars, such as fried and sweet foods.  To prevent constipation: ? Eat foods that are high in fiber, such as fresh fruits and vegetables, whole grains, and beans. ? Drink enough fluid to keep your urine clear or pale yellow. Activity  Exercise only as directed by your health care provider. Most women can continue their usual exercise routine during  pregnancy. Try to exercise for 30 minutes at least 5 days a week. Exercising will help you: ? Control your weight. ? Stay in shape. ? Be prepared for labor and delivery.  Experiencing pain or cramping in the lower abdomen or lower back is a good sign that you should stop exercising. Check with your health care provider before continuing with normal exercises.  Try to avoid standing for long periods of time. Move your legs often if you must stand in one place for a long time.  Avoid heavy lifting.  Wear low-heeled shoes and practice good posture.  You may continue to have sex unless your health care provider tells you not to. Relieving pain and discomfort  Wear a good support bra to relieve breast tenderness.  Take warm sitz baths to soothe any pain or discomfort caused by hemorrhoids. Use hemorrhoid cream if your health care provider approves.  Rest with your legs elevated if you have leg cramps or low back pain.  If you develop   varicose veins in your legs, wear support hose. Elevate your feet for 15 minutes, 3-4 times a day. Limit salt in your diet. Prenatal care  Schedule your prenatal visits by the twelfth week of pregnancy. They are usually scheduled monthly at first, then more often in the last 2 months before delivery.  Write down your questions. Take them to your prenatal visits.  Keep all your prenatal visits as told by your health care provider. This is important. Safety  Wear your seat belt at all times when driving.  Make a list of emergency phone numbers, including numbers for family, friends, the hospital, and police and fire departments. General instructions  Ask your health care provider for a referral to a local prenatal education class. Begin classes no later than the beginning of month 6 of your pregnancy.  Ask for help if you have counseling or nutritional needs during pregnancy. Your health care provider can offer advice or refer you to specialists for help  with various needs.  Do not use hot tubs, steam rooms, or saunas.  Do not douche or use tampons or scented sanitary pads.  Do not cross your legs for long periods of time.  Avoid cat litter boxes and soil used by cats. These carry germs that can cause birth defects in the baby and possibly loss of the fetus by miscarriage or stillbirth.  Avoid all smoking, herbs, alcohol, and medicines not prescribed by your health care provider. Chemicals in these products affect the formation and growth of the baby.  Do not use any products that contain nicotine or tobacco, such as cigarettes and e-cigarettes. If you need help quitting, ask your health care provider. You may receive counseling support and other resources to help you quit.  Schedule a dentist appointment. At home, brush your teeth with a soft toothbrush and be gentle when you floss. Contact a health care provider if:  You have dizziness.  You have mild pelvic cramps, pelvic pressure, or nagging pain in the abdominal area.  You have persistent nausea, vomiting, or diarrhea.  You have a bad smelling vaginal discharge.  You have pain when you urinate.  You notice increased swelling in your face, hands, legs, or ankles.  You are exposed to fifth disease or chickenpox.  You are exposed to German measles (rubella) and have never had it. Get help right away if:  You have a fever.  You are leaking fluid from your vagina.  You have spotting or bleeding from your vagina.  You have severe abdominal cramping or pain.  You have rapid weight gain or loss.  You vomit blood or material that looks like coffee grounds.  You develop a severe headache.  You have shortness of breath.  You have any kind of trauma, such as from a fall or a car accident. Summary  The first trimester of pregnancy is from week 1 until the end of week 13 (months 1 through 3).  Your body goes through many changes during pregnancy. The changes vary from  woman to woman.  You will have routine prenatal visits. During those visits, your health care provider will examine you, discuss any test results you may have, and talk with you about how you are feeling. This information is not intended to replace advice given to you by your health care provider. Make sure you discuss any questions you have with your health care provider. Document Released: 11/22/2001 Document Revised: 11/09/2016 Document Reviewed: 11/09/2016 Elsevier Interactive Patient Education  2017 [employer]  

## 2017-08-14 LAB — CULTURE, OB URINE

## 2017-08-14 LAB — URINE CULTURE, OB REFLEX

## 2017-08-17 LAB — CYTOLOGY - PAP
BACTERIAL VAGINITIS: POSITIVE — AB
CANDIDA VAGINITIS: NEGATIVE
Chlamydia: NEGATIVE
Diagnosis: NEGATIVE
Neisseria Gonorrhea: NEGATIVE
TRICH (WINDOWPATH): NEGATIVE

## 2017-08-18 MED ORDER — METRONIDAZOLE 500 MG PO TABS: 500.0000 mg | ORAL_TABLET | Freq: Two times a day (BID) | ORAL | 0 refills | Status: DC

## 2017-08-18 NOTE — Addendum Note (Signed)
Addended by: Jaynie CollinsANYANWU, Lorie Melichar A on: 08/18/2017 10:44 AM   Modules accepted: Orders

## 2017-08-21 ENCOUNTER — Encounter (INDEPENDENT_AMBULATORY_CARE_PROVIDER_SITE_OTHER): Payer: Medicaid Other | Admitting: *Deleted

## 2017-08-21 DIAGNOSIS — Z3482 Encounter for supervision of other normal pregnancy, second trimester: Secondary | ICD-10-CM

## 2017-08-24 LAB — COMPREHENSIVE METABOLIC PANEL
A/G RATIO: 1.6 (ref 1.2–2.2)
ALT: 13 IU/L (ref 0–32)
AST: 17 IU/L (ref 0–40)
Albumin: 4.2 g/dL (ref 3.5–5.5)
Alkaline Phosphatase: 64 IU/L (ref 39–117)
BUN/Creatinine Ratio: 10 (ref 9–23)
BUN: 7 mg/dL (ref 6–20)
Bilirubin Total: 0.4 mg/dL (ref 0.0–1.2)
CALCIUM: 9.6 mg/dL (ref 8.7–10.2)
CHLORIDE: 102 mmol/L (ref 96–106)
CO2: 23 mmol/L (ref 20–29)
Creatinine, Ser: 0.68 mg/dL (ref 0.57–1.00)
GFR calc Af Amer: 141 mL/min/{1.73_m2} (ref >59)
GFR calc non Af Amer: 122 mL/min/{1.73_m2} (ref >59)
Globulin, Total: 2.7 g/dL (ref 1.5–4.5)
Glucose: 76 mg/dL (ref 65–99)
POTASSIUM: 4.8 mmol/L (ref 3.5–5.2)
Sodium: 138 mmol/L (ref 134–144)
Total Protein: 6.9 g/dL (ref 6.0–8.5)

## 2017-08-24 LAB — OBSTETRIC PANEL, INCLUDING HIV
Antibody Screen: NEGATIVE
Basophils Absolute: 0 10*3/uL (ref 0.0–0.2)
Basos: 0 %
EOS (ABSOLUTE): 0.2 10*3/uL (ref 0.0–0.4)
EOS: 3 %
HEMOGLOBIN: 13.2 g/dL (ref 11.1–15.9)
HEP B S AG: NEGATIVE
HIV SCREEN 4TH GENERATION: NONREACTIVE
Hematocrit: 39.3 % (ref 34.0–46.6)
Immature Grans (Abs): 0 10*3/uL (ref 0.0–0.1)
Immature Granulocytes: 0 %
LYMPHS ABS: 2.2 10*3/uL (ref 0.7–3.1)
Lymphs: 29 %
MCH: 29.4 pg (ref 26.6–33.0)
MCHC: 33.6 g/dL (ref 31.5–35.7)
MCV: 88 fL (ref 79–97)
Monocytes Absolute: 0.5 10*3/uL (ref 0.1–0.9)
Monocytes: 6 %
NEUTROS ABS: 4.8 10*3/uL (ref 1.4–7.0)
Neutrophils: 62 %
Platelets: 272 10*3/uL (ref 150–379)
RBC: 4.49 x10E6/uL (ref 3.77–5.28)
RDW: 13.1 % (ref 12.3–15.4)
RH TYPE: POSITIVE
RPR: NONREACTIVE
Rubella Antibodies, IGG: 1.42 index (ref >0.99)
WBC: 7.7 10*3/uL (ref 3.4–10.8)

## 2017-08-24 LAB — SMN1 COPY NUMBER ANALYSIS (SMA CARRIER SCREENING)

## 2017-08-24 LAB — HEMOGLOBINOPATHY EVALUATION
HGB C: 0 %
HGB S: 0 %
HGB VARIANT: 0 %
Hemoglobin A2 Quantitation: 2.9 % (ref 1.8–3.2)
Hemoglobin F Quantitation: 0 % (ref 0.0–2.0)
Hgb A: 97.1 % (ref 96.4–98.8)

## 2017-08-24 LAB — PROTEIN / CREATININE RATIO, URINE
CREATININE, UR: 93.7 mg/dL
PROTEIN UR: 9.1 mg/dL
PROTEIN/CREAT RATIO: 97 mg/g{creat} (ref 0–200)

## 2017-08-24 LAB — CYSTIC FIBROSIS MUTATION 97: Interpretation: NOT DETECTED

## 2017-09-08 ENCOUNTER — Ambulatory Visit (INDEPENDENT_AMBULATORY_CARE_PROVIDER_SITE_OTHER): Payer: Medicaid Other | Admitting: Family Medicine

## 2017-09-08 VITALS — BP 118/77 | HR 86 | Wt 178.0 lb

## 2017-09-08 DIAGNOSIS — O99342 Other mental disorders complicating pregnancy, second trimester: Secondary | ICD-10-CM

## 2017-09-08 DIAGNOSIS — F419 Anxiety disorder, unspecified: Secondary | ICD-10-CM

## 2017-09-08 DIAGNOSIS — O99340 Other mental disorders complicating pregnancy, unspecified trimester: Secondary | ICD-10-CM

## 2017-09-08 DIAGNOSIS — F329 Major depressive disorder, single episode, unspecified: Secondary | ICD-10-CM | POA: Insufficient documentation

## 2017-09-08 DIAGNOSIS — F331 Major depressive disorder, recurrent, moderate: Secondary | ICD-10-CM

## 2017-09-08 DIAGNOSIS — Z3480 Encounter for supervision of other normal pregnancy, unspecified trimester: Secondary | ICD-10-CM

## 2017-09-08 DIAGNOSIS — Z3482 Encounter for supervision of other normal pregnancy, second trimester: Secondary | ICD-10-CM

## 2017-09-08 NOTE — Patient Instructions (Signed)
 Second Trimester of Pregnancy The second trimester is from week 14 through week 27 (months 4 through 6). The second trimester is often a time when you feel your best. Your body has adjusted to being pregnant, and you begin to feel better physically. Usually, morning sickness has lessened or quit completely, you may have more energy, and you may have an increase in appetite. The second trimester is also a time when the fetus is growing rapidly. At the end of the sixth month, the fetus is about 9 inches long and weighs about 1 pounds. You will likely begin to feel the baby move (quickening) between 16 and 20 weeks of pregnancy. Body changes during your second trimester Your body continues to go through many changes during your second trimester. The changes vary from woman to woman.  Your weight will continue to increase. You will notice your lower abdomen bulging out.  You may begin to get stretch marks on your hips, abdomen, and breasts.  You may develop headaches that can be relieved by medicines. The medicines should be approved by your health care provider.  You may urinate more often because the fetus is pressing on your bladder.  You may develop or continue to have heartburn as a result of your pregnancy.  You may develop constipation because certain hormones are causing the muscles that push waste through your intestines to slow down.  You may develop hemorrhoids or swollen, bulging veins (varicose veins).  You may have back pain. This is caused by: ? Weight gain. ? Pregnancy hormones that are relaxing the joints in your pelvis. ? A shift in weight and the muscles that support your balance.  Your breasts will continue to grow and they will continue to become tender.  Your gums may bleed and may be sensitive to brushing and flossing.  Dark spots or blotches (chloasma, mask of pregnancy) may develop on your face. This will likely fade after the baby is born.  A dark line from  your belly button to the pubic area (linea nigra) may appear. This will likely fade after the baby is born.  You may have changes in your hair. These can include thickening of your hair, rapid growth, and changes in texture. Some women also have hair loss during or after pregnancy, or hair that feels dry or thin. Your hair will most likely return to normal after your baby is born.  What to expect at prenatal visits During a routine prenatal visit:  You will be weighed to make sure you and the fetus are growing normally.  Your blood pressure will be taken.  Your abdomen will be measured to track your baby's growth.  The fetal heartbeat will be listened to.  Any test results from the previous visit will be discussed.  Your health care provider may ask you:  How you are feeling.  If you are feeling the baby move.  If you have had any abnormal symptoms, such as leaking fluid, bleeding, severe headaches, or abdominal cramping.  If you are using any tobacco products, including cigarettes, chewing tobacco, and electronic cigarettes.  If you have any questions.  Other tests that may be performed during your second trimester include:  Blood tests that check for: ? Low iron levels (anemia). ? High blood sugar that affects pregnant women (gestational diabetes) between 24 and 28 weeks. ? Rh antibodies. This is to check for a protein on red blood cells (Rh factor).  Urine tests to check for infections, diabetes,   or protein in the urine.  An ultrasound to confirm the proper growth and development of the baby.  An amniocentesis to check for possible genetic problems.  Fetal screens for spina bifida and Down syndrome.  HIV (human immunodeficiency virus) testing. Routine prenatal testing includes screening for HIV, unless you choose not to have this test.  Follow these instructions at home: Medicines  Follow your health care provider's instructions regarding medicine use. Specific  medicines may be either safe or unsafe to take during pregnancy.  Take a prenatal vitamin that contains at least 600 micrograms (mcg) of folic acid.  If you develop constipation, try taking a stool softener if your health care provider approves. Eating and drinking  Eat a balanced diet that includes fresh fruits and vegetables, whole grains, good sources of protein such as meat, eggs, or tofu, and low-fat dairy. Your health care provider will help you determine the amount of weight gain that is right for you.  Avoid raw meat and uncooked cheese. These carry germs that can cause birth defects in the baby.  If you have low calcium intake from food, talk to your health care provider about whether you should take a daily calcium supplement.  Limit foods that are high in fat and processed sugars, such as fried and sweet foods.  To prevent constipation: ? Drink enough fluid to keep your urine clear or pale yellow. ? Eat foods that are high in fiber, such as fresh fruits and vegetables, whole grains, and beans. Activity  Exercise only as directed by your health care provider. Most women can continue their usual exercise routine during pregnancy. Try to exercise for 30 minutes at least 5 days a week. Stop exercising if you experience uterine contractions.  Avoid heavy lifting, wear low heel shoes, and practice good posture.  A sexual relationship may be continued unless your health care provider directs you otherwise. Relieving pain and discomfort  Wear a good support bra to prevent discomfort from breast tenderness.  Take warm sitz baths to soothe any pain or discomfort caused by hemorrhoids. Use hemorrhoid cream if your health care provider approves.  Rest with your legs elevated if you have leg cramps or low back pain.  If you develop varicose veins, wear support hose. Elevate your feet for 15 minutes, 3-4 times a day. Limit salt in your diet. Prenatal Care  Write down your questions.  Take them to your prenatal visits.  Keep all your prenatal visits as told by your health care provider. This is important. Safety  Wear your seat belt at all times when driving.  Make a list of emergency phone numbers, including numbers for family, friends, the hospital, and police and fire departments. General instructions  Ask your health care provider for a referral to a local prenatal education class. Begin classes no later than the beginning of month 6 of your pregnancy.  Ask for help if you have counseling or nutritional needs during pregnancy. Your health care provider can offer advice or refer you to specialists for help with various needs.  Do not use hot tubs, steam rooms, or saunas.  Do not douche or use tampons or scented sanitary pads.  Do not cross your legs for long periods of time.  Avoid cat litter boxes and soil used by cats. These carry germs that can cause birth defects in the baby and possibly loss of the fetus by miscarriage or stillbirth.  Avoid all smoking, herbs, alcohol, and unprescribed drugs. Chemicals in these products   can affect the formation and growth of the baby.  Do not use any products that contain nicotine or tobacco, such as cigarettes and e-cigarettes. If you need help quitting, ask your health care provider.  Visit your dentist if you have not gone yet during your pregnancy. Use a soft toothbrush to brush your teeth and be gentle when you floss. Contact a health care provider if:  You have dizziness.  You have mild pelvic cramps, pelvic pressure, or nagging pain in the abdominal area.  You have persistent nausea, vomiting, or diarrhea.  You have a bad smelling vaginal discharge.  You have pain when you urinate. Get help right away if:  You have a fever.  You are leaking fluid from your vagina.  You have spotting or bleeding from your vagina.  You have severe abdominal cramping or pain.  You have rapid weight gain or weight  loss.  You have shortness of breath with chest pain.  You notice sudden or extreme swelling of your face, hands, ankles, feet, or legs.  You have not felt your baby move in over an hour.  You have severe headaches that do not go away when you take medicine.  You have vision changes. Summary  The second trimester is from week 14 through week 27 (months 4 through 6). It is also a time when the fetus is growing rapidly.  Your body goes through many changes during pregnancy. The changes vary from woman to woman.  Avoid all smoking, herbs, alcohol, and unprescribed drugs. These chemicals affect the formation and growth your baby.  Do not use any tobacco products, such as cigarettes, chewing tobacco, and e-cigarettes. If you need help quitting, ask your health care provider.  Contact your health care provider if you have any questions. Keep all prenatal visits as told by your health care provider. This is important. This information is not intended to replace advice given to you by your health care provider. Make sure you discuss any questions you have with your health care provider. Document Released: 11/22/2001 Document Revised: 05/05/2016 Document Reviewed: 01/29/2013 [employer] Interactive Patient Education  2017 [employer]   Breastfeeding Deciding to breastfeed is one of the best choices you can make for you and your baby. A change in hormones during pregnancy causes your breast tissue to grow and increases the number and size of your milk ducts. These hormones also allow proteins, sugars, and fats from your blood supply to make breast milk in your milk-producing glands. Hormones prevent breast milk from being released before your baby is born as well as prompt milk flow after birth. Once breastfeeding has begun, thoughts of your baby, as well as his or her sucking or crying, can stimulate the release of milk from your milk-producing glands. Benefits of breastfeeding For Your  Baby  Your first milk (colostrum) helps your baby's digestive system function better.  There are antibodies in your milk that help your baby fight off infections.  Your baby has a lower incidence of asthma, allergies, and sudden infant death syndrome.  The nutrients in breast milk are better for your baby than infant formulas and are designed uniquely for your baby's needs.  Breast milk improves your baby's brain development.  Your baby is less likely to develop other conditions, such as childhood obesity, asthma, or type 2 diabetes mellitus.  For You  Breastfeeding helps to create a very special bond between you and your baby.  Breastfeeding is convenient. Breast milk is always available at   the correct temperature and costs nothing.  Breastfeeding helps to burn calories and helps you lose the weight gained during pregnancy.  Breastfeeding makes your uterus contract to its prepregnancy size faster and slows bleeding (lochia) after you give birth.  Breastfeeding helps to lower your risk of developing type 2 diabetes mellitus, osteoporosis, and breast or ovarian cancer later in life.  Signs that your baby is hungry Early Signs of Hunger  Increased alertness or activity.  Stretching.  Movement of the head from side to side.  Movement of the head and opening of the mouth when the corner of the mouth or cheek is stroked (rooting).  Increased sucking sounds, smacking lips, cooing, sighing, or squeaking.  Hand-to-mouth movements.  Increased sucking of fingers or hands.  Late Signs of Hunger  Fussing.  Intermittent crying.  Extreme Signs of Hunger Signs of extreme hunger will require calming and consoling before your baby will be able to breastfeed successfully. Do not wait for the following signs of extreme hunger to occur before you initiate breastfeeding:  Restlessness.  A loud, strong cry.  Screaming.  Breastfeeding basics Breastfeeding Initiation  Find a  comfortable place to sit or lie down, with your neck and back well supported.  Place a pillow or rolled up blanket under your baby to bring him or her to the level of your breast (if you are seated). Nursing pillows are specially designed to help support your arms and your baby while you breastfeed.  Make sure that your baby's abdomen is facing your abdomen.  Gently massage your breast. With your fingertips, massage from your chest wall toward your nipple in a circular motion. This encourages milk flow. You may need to continue this action during the feeding if your milk flows slowly.  Support your breast with 4 fingers underneath and your thumb above your nipple. Make sure your fingers are well away from your nipple and your baby's mouth.  Stroke your baby's lips gently with your finger or nipple.  When your baby's mouth is open wide enough, quickly bring your baby to your breast, placing your entire nipple and as much of the colored area around your nipple (areola) as possible into your baby's mouth. ? More areola should be visible above your baby's upper lip than below the lower lip. ? Your baby's tongue should be between his or her lower gum and your breast.  Ensure that your baby's mouth is correctly positioned around your nipple (latched). Your baby's lips should create a seal on your breast and be turned out (everted).  It is common for your baby to suck about 2-3 minutes in order to start the flow of breast milk.  Latching Teaching your baby how to latch on to your breast properly is very important. An improper latch can cause nipple pain and decreased milk supply for you and poor weight gain in your baby. Also, if your baby is not latched onto your nipple properly, he or she may swallow some air during feeding. This can make your baby fussy. Burping your baby when you switch breasts during the feeding can help to get rid of the air. However, teaching your baby to latch on properly is  still the best way to prevent fussiness from swallowing air while breastfeeding. Signs that your baby has successfully latched on to your nipple:  Silent tugging or silent sucking, without causing you pain.  Swallowing heard between every 3-4 sucks.  Muscle movement above and in front of his or her   ears while sucking.  Signs that your baby has not successfully latched on to nipple:  Sucking sounds or smacking sounds from your baby while breastfeeding.  Nipple pain.  If you think your baby has not latched on correctly, slip your finger into the corner of your baby's mouth to break the suction and place it between your baby's gums. Attempt breastfeeding initiation again. Signs of Successful Breastfeeding Signs from your baby:  A gradual decrease in the number of sucks or complete cessation of sucking.  Falling asleep.  Relaxation of his or her body.  Retention of a small amount of milk in his or her mouth.  Letting go of your breast by himself or herself.  Signs from you:  Breasts that have increased in firmness, weight, and size 1-3 hours after feeding.  Breasts that are softer immediately after breastfeeding.  Increased milk volume, as well as a change in milk consistency and color by the fifth day of breastfeeding.  Nipples that are not sore, cracked, or bleeding.  Signs That Your Baby is Getting Enough Milk  Wetting at least 1-2 diapers during the first 24 hours after birth.  Wetting at least 5-6 diapers every 24 hours for the first week after birth. The urine should be clear or pale yellow by 5 days after birth.  Wetting 6-8 diapers every 24 hours as your baby continues to grow and develop.  At least 3 stools in a 24-hour period by age 5 days. The stool should be soft and yellow.  At least 3 stools in a 24-hour period by age 7 days. The stool should be seedy and yellow.  No loss of weight greater than 10% of birth weight during the first 3 days of age.  Average  weight gain of 4-7 ounces (113-198 g) per week after age 4 days.  Consistent daily weight gain by age 5 days, without weight loss after the age of 2 weeks.  After a feeding, your baby may spit up a small amount. This is common. Breastfeeding frequency and duration Frequent feeding will help you make more milk and can prevent sore nipples and breast engorgement. Breastfeed when you feel the need to reduce the fullness of your breasts or when your baby shows signs of hunger. This is called "breastfeeding on demand." Avoid introducing a pacifier to your baby while you are working to establish breastfeeding (the first 4-6 weeks after your baby is born). After this time you may choose to use a pacifier. Research has shown that pacifier use during the first year of a baby's life decreases the risk of sudden infant death syndrome (SIDS). Allow your baby to feed on each breast as long as he or she wants. Breastfeed until your baby is finished feeding. When your baby unlatches or falls asleep while feeding from the first breast, offer the second breast. Because newborns are often sleepy in the first few weeks of life, you may need to awaken your baby to get him or her to feed. Breastfeeding times will vary from baby to baby. However, the following rules can serve as a guide to help you ensure that your baby is properly fed:  Newborns (babies 4 weeks of age or younger) may breastfeed every 1-3 hours.  Newborns should not go longer than 3 hours during the day or 5 hours during the night without breastfeeding.  You should breastfeed your baby a minimum of 8 times in a 24-hour period until you begin to introduce solid foods to your   baby at around 6 months of age.  Breast milk pumping Pumping and storing breast milk allows you to ensure that your baby is exclusively fed your breast milk, even at times when you are unable to breastfeed. This is especially important if you are going back to work while you are still  breastfeeding or when you are not able to be present during feedings. Your lactation consultant can give you guidelines on how long it is safe to store breast milk. A breast pump is a machine that allows you to pump milk from your breast into a sterile bottle. The pumped breast milk can then be stored in a refrigerator or freezer. Some breast pumps are operated by hand, while others use electricity. Ask your lactation consultant which type will work best for you. Breast pumps can be purchased, but some hospitals and breastfeeding support groups lease breast pumps on a monthly basis. A lactation consultant can teach you how to hand express breast milk, if you prefer not to use a pump. Caring for your breasts while you breastfeed Nipples can become dry, cracked, and sore while breastfeeding. The following recommendations can help keep your breasts moisturized and healthy:  Avoid using soap on your nipples.  Wear a supportive bra. Although not required, special nursing bras and tank tops are designed to allow access to your breasts for breastfeeding without taking off your entire bra or top. Avoid wearing underwire-style bras or extremely tight bras.  Air dry your nipples for 3-4minutes after each feeding.  Use only cotton bra pads to absorb leaked breast milk. Leaking of breast milk between feedings is normal.  Use lanolin on your nipples after breastfeeding. Lanolin helps to maintain your skin's normal moisture barrier. If you use pure lanolin, you do not need to wash it off before feeding your baby again. Pure lanolin is not toxic to your baby. You may also hand express a few drops of breast milk and gently massage that milk into your nipples and allow the milk to air dry.  In the first few weeks after giving birth, some women experience extremely full breasts (engorgement). Engorgement can make your breasts feel heavy, warm, and tender to the touch. Engorgement peaks within 3-5 days after you give  birth. The following recommendations can help ease engorgement:  Completely empty your breasts while breastfeeding or pumping. You may want to start by applying warm, moist heat (in the shower or with warm water-soaked hand towels) just before feeding or pumping. This increases circulation and helps the milk flow. If your baby does not completely empty your breasts while breastfeeding, pump any extra milk after he or she is finished.  Wear a snug bra (nursing or regular) or tank top for 1-2 days to signal your body to slightly decrease milk production.  Apply ice packs to your breasts, unless this is too uncomfortable for you.  Make sure that your baby is latched on and positioned properly while breastfeeding.  If engorgement persists after 48 hours of following these recommendations, contact your health care provider or a lactation consultant. Overall health care recommendations while breastfeeding  Eat healthy foods. Alternate between meals and snacks, eating 3 of each per day. Because what you eat affects your breast milk, some of the foods may make your baby more irritable than usual. Avoid eating these foods if you are sure that they are negatively affecting your baby.  Drink milk, fruit juice, and water to satisfy your thirst (about 10 glasses a day).    Rest often, relax, and continue to take your prenatal vitamins to prevent fatigue, stress, and anemia.  Continue breast self-awareness checks.  Avoid chewing and smoking tobacco. Chemicals from cigarettes that pass into breast milk and exposure to secondhand smoke may harm your baby.  Avoid alcohol and drug use, including marijuana. Some medicines that may be harmful to your baby can pass through breast milk. It is important to ask your health care provider before taking any medicine, including all over-the-counter and prescription medicine as well as vitamin and herbal supplements. It is possible to become pregnant while breastfeeding.  If birth control is desired, ask your health care provider about options that will be safe for your baby. Contact a health care provider if:  You feel like you want to stop breastfeeding or have become frustrated with breastfeeding.  You have painful breasts or nipples.  Your nipples are cracked or bleeding.  Your breasts are red, tender, or warm.  You have a swollen area on either breast.  You have a fever or chills.  You have nausea or vomiting.  You have drainage other than breast milk from your nipples.  Your breasts do not become full before feedings by the fifth day after you give birth.  You feel sad and depressed.  Your baby is too sleepy to eat well.  Your baby is having trouble sleeping.  Your baby is wetting less than 3 diapers in a 24-hour period.  Your baby has less than 3 stools in a 24-hour period.  Your baby's skin or the white part of his or her eyes becomes yellow.  Your baby is not gaining weight by 5 days of age. Get help right away if:  Your baby is overly tired (lethargic) and does not want to wake up and feed.  Your baby develops an unexplained fever. This information is not intended to replace advice given to you by your health care provider. Make sure you discuss any questions you have with your health care provider. Document Released: 11/28/2005 Document Revised: 05/11/2016 Document Reviewed: 05/22/2013 [employer] Interactive Patient Education  2017 [employer]  
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
## 2017-09-08 NOTE — Progress Notes (Signed)
Pt states that she felt baby moving but hasn't felt anything in 4 days. Had spotting after intercourse the other day.

## 2017-09-11 ENCOUNTER — Institutional Professional Consult (permissible substitution): Payer: Medicaid Other

## 2017-09-11 NOTE — BH Specialist Note (Deleted)
Integrated Behavioral Health Initial Visit  MRN: [id number] Name: [name]  Number of Integrated Behavioral Health Clinician visits:: {IBH Number of Visits:21014052} Session Start time: ***  Session End time: *** Total time: {IBH Total Time:21014050}  Type of Service: Integrated Behavioral Health- Individual/Family Interpretor:{yes OZ:308657} Interpretor Name and Language: ***   Warm Hand Off Completed.       SUBJECTIVE: [name] is a 26 y.o. female accompanied by {CHL AMB ACCOMPANIED QI:6962952841} Patient was referred by *** for ***. Patient reports the following symptoms/concerns: *** Duration of problem: ***; Severity of problem: {Mild/Moderate/Severe:20260}  OBJECTIVE: Mood: {BHH MOOD:22306} and Affect: {BHH AFFECT:22307} Risk of harm to self or others: {CHL AMB BH Suicide Current Mental Status:21022748}  LIFE CONTEXT: Family and Social: *** School/Work: *** Self-Care: *** Life Changes: ***  GOALS ADDRESSED: Patient will: 1. Reduce symptoms of: {IBH Symptoms:21014056} 2. Increase knowledge and/or ability of: {IBH Patient Tools:21014057}  3. Demonstrate ability to: {IBH Goals:21014053}  INTERVENTIONS: Interventions utilized: {IBH Interventions:21014054}  Standardized Assessments completed: {IBH Screening Tools:21014051}  ASSESSMENT: Patient currently experiencing ***.   Patient may benefit from ***.  PLAN: 1. Follow up with behavioral health clinician on : *** 2. Behavioral recommendations: *** 3. Referral(s): {IBH Referrals:21014055} 4. "From scale of 1-10, how likely are you to follow plan?": ***  [name], LCSWA

## 2017-09-14 ENCOUNTER — Other Ambulatory Visit: Payer: Self-pay | Admitting: Obstetrics & Gynecology

## 2017-09-14 ENCOUNTER — Other Ambulatory Visit (HOSPITAL_COMMUNITY): Payer: Self-pay | Admitting: *Deleted

## 2017-09-14 ENCOUNTER — Ambulatory Visit (HOSPITAL_COMMUNITY): Admission: RE | Admit: 2017-09-14 | Payer: Medicaid Other | Source: Ambulatory Visit

## 2017-09-14 ENCOUNTER — Ambulatory Visit (INDEPENDENT_AMBULATORY_CARE_PROVIDER_SITE_OTHER): Payer: Medicaid Other | Admitting: Clinical

## 2017-09-14 ENCOUNTER — Encounter (HOSPITAL_COMMUNITY): Payer: Self-pay

## 2017-09-14 ENCOUNTER — Ambulatory Visit (HOSPITAL_COMMUNITY)
Admission: RE | Admit: 2017-09-14 | Discharge: 2017-09-14 | Disposition: A | Discharge status: Home / Self Care | Payer: Medicaid Other | Source: Ambulatory Visit | Attending: Obstetrics & Gynecology | Admitting: Obstetrics & Gynecology

## 2017-09-14 DIAGNOSIS — F331 Major depressive disorder, recurrent, moderate: Secondary | ICD-10-CM

## 2017-09-14 DIAGNOSIS — Z3A15 15 weeks gestation of pregnancy: Secondary | ICD-10-CM

## 2017-09-14 DIAGNOSIS — Z3A14 14 weeks gestation of pregnancy: Secondary | ICD-10-CM | POA: Diagnosis not present

## 2017-09-14 DIAGNOSIS — Z3689 Encounter for other specified antenatal screening: Secondary | ICD-10-CM

## 2017-09-14 DIAGNOSIS — F39 Unspecified mood [affective] disorder: Secondary | ICD-10-CM

## 2017-09-14 DIAGNOSIS — Z3682 Encounter for antenatal screening for nuchal translucency: Secondary | ICD-10-CM

## 2017-09-14 DIAGNOSIS — Z3687 Encounter for antenatal screening for uncertain dates: Secondary | ICD-10-CM | POA: Diagnosis present

## 2017-09-14 DIAGNOSIS — Z3A13 13 weeks gestation of pregnancy: Secondary | ICD-10-CM

## 2017-09-14 DIAGNOSIS — F419 Anxiety disorder, unspecified: Secondary | ICD-10-CM

## 2017-09-14 DIAGNOSIS — Z3480 Encounter for supervision of other normal pregnancy, unspecified trimester: Secondary | ICD-10-CM

## 2017-09-14 DIAGNOSIS — O99340 Other mental disorders complicating pregnancy, unspecified trimester: Secondary | ICD-10-CM

## 2017-09-14 HISTORY — DX: Anxiety disorder, unspecified: F41.9

## 2017-09-14 HISTORY — DX: Bipolar disorder, unspecified: F31.9
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
## 2017-09-15 ENCOUNTER — Ambulatory Visit (INDEPENDENT_AMBULATORY_CARE_PROVIDER_SITE_OTHER): Payer: Medicaid Other | Admitting: Obstetrics & Gynecology

## 2017-09-15 VITALS — BP 121/76 | HR 89 | Wt 176.8 lb

## 2017-09-15 DIAGNOSIS — Z3482 Encounter for supervision of other normal pregnancy, second trimester: Secondary | ICD-10-CM

## 2017-09-15 DIAGNOSIS — Z23 Encounter for immunization: Secondary | ICD-10-CM | POA: Diagnosis not present

## 2017-09-15 DIAGNOSIS — Z3480 Encounter for supervision of other normal pregnancy, unspecified trimester: Secondary | ICD-10-CM

## 2017-09-15 NOTE — Progress Notes (Signed)
   PRENATAL VISIT NOTE  Subjective:  [name] is a 26 y.o. SW [phone] at [redacted]w[redacted]d being seen today for ongoing prenatal care.  She is currently monitored for the following issues for this low-risk pregnancy and has Supervision of other normal pregnancy, antepartum; History of pre-eclampsia in prior pregnancy, currently pregnant; Anxiety disorder affecting pregnancy, antepartum; and Major depressive disorder on her problem list.  Patient reports hungry, still having some vomitting, taking phenergan. Refuses supposity.  Contractions: Not present. Vag. Bleeding: None.   . Denies leaking of fluid.   The following portions of the patient's history were reviewed and updated as appropriate: allergies, current medications, past family history, past medical history, past social history, past surgical history and problem list. Problem list updated.  Objective:   Vitals:   09/15/17 0948  BP: 121/76  Pulse: 89  Weight: 176 lb 12.8 oz (80.2 kg)    Fetal Status:           General:  Alert, oriented and cooperative. Patient is in no acute distress.  Skin: Skin is warm and dry. No rash noted.   Cardiovascular: Normal heart rate noted  Respiratory: Normal respiratory effort, no problems with respiration noted  Abdomen: Soft, gravid, appropriate for gestational age.  Pain/Pressure: Absent     Pelvic: Cervical exam deferred        Extremities: Normal range of motion.  Edema: None  Mental Status:  Normal mood and affect. Normal behavior. Normal judgment and thought content.   Assessment and Plan:  Pregnancy: G4P3003 at [redacted]w[redacted]d  1. Supervision of other normal pregnancy, antepartum   Preterm labor symptoms and general obstetric precautions including but not limited to vaginal bleeding, contractions, leaking of fluid and fetal movement were reviewed in detail with the patient. Please refer to After Visit Summary for other counseling recommendations.  No Follow-up on file.   Allie Bossier, MD

## 2017-09-19 ENCOUNTER — Telehealth: Payer: Self-pay | Admitting: *Deleted

## 2017-09-19 DIAGNOSIS — R51 Headache: Principal | ICD-10-CM

## 2017-09-19 DIAGNOSIS — R519 Headache, unspecified: Secondary | ICD-10-CM

## 2017-09-19 MED ORDER — BUTALBITAL-APAP-CAFFEINE 50-325-40 MG PO CAPS: 1.0000 | ORAL_CAPSULE | Freq: Four times a day (QID) | ORAL | 0 refills | Status: DC | PRN

## 2017-09-19 NOTE — Telephone Encounter (Signed)
Called in stating she has headache not relieved by  tylenol q6h. Per Dr Macon Large sent in Fioricet for headache. Pt expressed understanding.

## 2017-09-21 ENCOUNTER — Telehealth: Payer: Self-pay | Admitting: Clinical

## 2017-09-21 NOTE — Telephone Encounter (Signed)
Attempt to f/u with pt; left HIPPA-compliant message to call Asher Muir at [facility] for [facility] at [phone].

## 2017-09-26 ENCOUNTER — Telehealth: Payer: Self-pay

## 2017-09-26 NOTE — Telephone Encounter (Signed)
Patient called regarding something being call in for an ear infection.I have advised patient we can not call in anything for ear infection at this time. Patient would need to be seen by pcp or urgent care to determine if she really does have a ear infection. Patient voice understanding at this time.

## 2017-10-04 ENCOUNTER — Encounter (HOSPITAL_COMMUNITY): Payer: Self-pay | Admitting: Maternal and Fetal Medicine

## 2017-10-06 ENCOUNTER — Encounter: Payer: Medicaid Other | Admitting: Obstetrics & Gynecology

## 2017-10-12 ENCOUNTER — Ambulatory Visit (HOSPITAL_COMMUNITY)
Admission: RE | Admit: 2017-10-12 | Discharge: 2017-10-12 | Disposition: A | Discharge status: Home / Self Care | Payer: Medicaid Other | Source: Ambulatory Visit | Attending: Obstetrics & Gynecology | Admitting: Obstetrics & Gynecology

## 2017-10-12 ENCOUNTER — Other Ambulatory Visit (HOSPITAL_COMMUNITY): Payer: Self-pay | Admitting: Maternal and Fetal Medicine

## 2017-10-12 DIAGNOSIS — O99342 Other mental disorders complicating pregnancy, second trimester: Secondary | ICD-10-CM | POA: Diagnosis not present

## 2017-10-12 DIAGNOSIS — Z3A18 18 weeks gestation of pregnancy: Secondary | ICD-10-CM

## 2017-10-12 DIAGNOSIS — Z3689 Encounter for other specified antenatal screening: Secondary | ICD-10-CM | POA: Diagnosis present

## 2017-10-12 DIAGNOSIS — F419 Anxiety disorder, unspecified: Secondary | ICD-10-CM | POA: Diagnosis not present

## 2017-10-12 DIAGNOSIS — O99340 Other mental disorders complicating pregnancy, unspecified trimester: Secondary | ICD-10-CM

## 2017-10-14 ENCOUNTER — Inpatient Hospital Stay (HOSPITAL_COMMUNITY)
Admission: AD | Admit: 2017-10-14 | Discharge: 2017-10-14 | Disposition: A | Discharge status: Home / Self Care | Payer: Medicaid Other | Source: Ambulatory Visit | Attending: Obstetrics and Gynecology | Admitting: Obstetrics and Gynecology

## 2017-10-14 ENCOUNTER — Encounter (HOSPITAL_COMMUNITY): Payer: Self-pay | Admitting: *Deleted

## 2017-10-14 DIAGNOSIS — Z87891 Personal history of nicotine dependence: Secondary | ICD-10-CM | POA: Diagnosis not present

## 2017-10-14 DIAGNOSIS — O26892 Other specified pregnancy related conditions, second trimester: Secondary | ICD-10-CM | POA: Insufficient documentation

## 2017-10-14 DIAGNOSIS — R51 Headache: Secondary | ICD-10-CM | POA: Diagnosis present

## 2017-10-14 DIAGNOSIS — O99342 Other mental disorders complicating pregnancy, second trimester: Secondary | ICD-10-CM | POA: Insufficient documentation

## 2017-10-14 DIAGNOSIS — F339 Major depressive disorder, recurrent, unspecified: Secondary | ICD-10-CM | POA: Diagnosis not present

## 2017-10-14 DIAGNOSIS — O99340 Other mental disorders complicating pregnancy, unspecified trimester: Secondary | ICD-10-CM

## 2017-10-14 DIAGNOSIS — Z3A19 19 weeks gestation of pregnancy: Secondary | ICD-10-CM | POA: Diagnosis not present

## 2017-10-14 DIAGNOSIS — R6889 Other general symptoms and signs: Secondary | ICD-10-CM

## 2017-10-14 DIAGNOSIS — F419 Anxiety disorder, unspecified: Secondary | ICD-10-CM | POA: Insufficient documentation

## 2017-10-14 DIAGNOSIS — F331 Major depressive disorder, recurrent, moderate: Secondary | ICD-10-CM

## 2017-10-14 DIAGNOSIS — R519 Headache, unspecified: Secondary | ICD-10-CM

## 2017-10-14 NOTE — MAU Note (Signed)
Pt reports she has noticed a "soft spot" on her head yesterday and it gives her an instant headache when she touches it. Had not ever noticed it before.  No other  Pregnancy complaints at this time.

## 2017-10-14 NOTE — MAU Note (Signed)
Pt examined by D.Lawson,CNM. Explained reasons for her headache and "soft spot". Make verbalized understanding.

## 2017-10-14 NOTE — MAU Provider Note (Signed)
History   G4P3003 @ 19 wks in with concern over tender area at top of her head. States noticed it yesterday.  CSN: [id number]  Arrival date & time 10/14/17  1149   None     Chief Complaint  Patient presents with  . Headache    HPI  Past Medical History:  Diagnosis Date  . Anxiety   . Asthma   . Bipolar 1 disorder [facility](HCC)     Past Surgical History:  Procedure Laterality Date  . WISDOM TOOTH EXTRACTION      Family History  Problem Relation Age of Onset  . Hypertension Mother   . Hypertension Father     Social History  Substance Use Topics  . Smoking status: Former Smoker    Packs/day: 2.00    Types: Cigarettes    Quit date: 09/14/2016  . Smokeless tobacco: Never Used  . Alcohol use No    OB History    Gravida Para Term Preterm AB Living   4 3 3     3    SAB TAB Ectopic Multiple Live Births           3      Review of Systems  Constitutional: Negative.   HENT: Negative.   Eyes: Negative.   Respiratory: Negative.   Cardiovascular: Negative.   Gastrointestinal: Negative.   Endocrine: Negative.   Genitourinary: Negative.   Musculoskeletal:       Tenderness at top of head  Skin: Negative.   Allergic/Immunologic: Negative.   Neurological: Negative.   Hematological: Negative.   Psychiatric/Behavioral: Negative.     Allergies  Sulfa antibiotics and Ibuprofen  Home Medications    BP 105/82   Pulse (!) 115   Temp 97.7 F (36.5 C) (Oral)   Resp 18   Ht 5\' 3"  (1.6 m)   Wt 179 lb (81.2 kg)   LMP 06/12/2017   BMI 31.71 kg/m   Physical Exam  Constitutional: She is oriented to person, place, and time. She appears well-developed and well-nourished.  HENT:  Head: Normocephalic.  Pulmonary/Chest: Effort normal.  Musculoskeletal: Normal range of motion.  Neurological: She is alert and oriented to person, place, and time. She has normal reflexes.  Skin: Skin is warm and dry.  Psychiatric: She has a normal mood and affect. Her behavior is normal.  Judgment and thought content normal.    MAU Course  Procedures (including critical care time)  Labs Reviewed - No data to display No results found.   1. Tenderness of head and neck   2. Anxiety disorder affecting pregnancy, antepartum   3. Moderate episode of recurrent major depressive disorder (HCC)       MDM  VSS, head inspected closely and no defects or abrasions noted. Pt reassured and d/c home

## 2017-10-17 ENCOUNTER — Telehealth: Payer: Self-pay | Admitting: *Deleted

## 2017-10-17 ENCOUNTER — Encounter: Payer: Medicaid Other | Admitting: Obstetrics & Gynecology

## 2017-10-17 DIAGNOSIS — R519 Headache, unspecified: Secondary | ICD-10-CM

## 2017-10-17 DIAGNOSIS — R51 Headache: Principal | ICD-10-CM

## 2017-10-17 MED ORDER — BUTALBITAL-APAP-CAFFEINE 50-325-40 MG PO CAPS: 1.0000 | ORAL_CAPSULE | Freq: Four times a day (QID) | ORAL | 0 refills | Status: DC | PRN

## 2017-10-17 NOTE — Telephone Encounter (Signed)
-----   Message from Lindell SparHeather L Bacon, [place] sent at 10/17/2017 10:39 AM EST ----- Regarding: Rx refill request [facility] [address]  Thanks

## 2017-10-18 ENCOUNTER — Ambulatory Visit (INDEPENDENT_AMBULATORY_CARE_PROVIDER_SITE_OTHER): Payer: Medicaid Other | Admitting: Family Medicine

## 2017-10-18 ENCOUNTER — Encounter: Payer: Self-pay | Admitting: Family Medicine

## 2017-10-18 ENCOUNTER — Encounter: Payer: Medicaid Other | Admitting: Nurse Practitioner

## 2017-10-18 DIAGNOSIS — Z3480 Encounter for supervision of other normal pregnancy, unspecified trimester: Secondary | ICD-10-CM

## 2017-10-18 DIAGNOSIS — Z3482 Encounter for supervision of other normal pregnancy, second trimester: Secondary | ICD-10-CM

## 2017-10-18 NOTE — Progress Notes (Signed)
   PRENATAL VISIT NOTE  Subjective:  Megan LeatherwoodJennifer Gibeau is a 26 y.o. [phone] at [redacted]w[redacted]d being seen today for ongoing prenatal care.  She is currently monitored for the following issues for this low-risk pregnancy and has Supervision of other normal pregnancy, antepartum; History of pre-eclampsia in prior pregnancy, currently pregnant; Anxiety disorder affecting pregnancy, antepartum; and Major depressive disorder on their problem list.  Patient reports no complaints.  Contractions: Not present. Vag. Bleeding: None.  Movement: Present. Denies leaking of fluid.   The following portions of the patient's history were reviewed and updated as appropriate: allergies, current medications, past family history, past medical history, past social history, past surgical history and problem list. Problem list updated.  Objective:   Vitals:   10/18/17 0949  BP: 123/75  Pulse: 90  Weight: 178 lb (80.7 kg)    Fetal Status: Fetal Heart Rate (bpm): 150   Movement: Present     General:  Alert, oriented and cooperative. Patient is in no acute distress.  Skin: Skin is warm and dry. No rash noted.   Cardiovascular: Normal heart rate noted  Respiratory: Normal respiratory effort, no problems with respiration noted  Abdomen: Soft, gravid, appropriate for gestational age.  Pain/Pressure: Absent     Pelvic: Cervical exam deferred        Extremities: Normal range of motion.  Edema: None  Mental Status:  Normal mood and affect. Normal behavior. Normal judgment and thought content.   Assessment and Plan:  Pregnancy: G4P3003 at [redacted]w[redacted]d  1. Supervision of other normal pregnancy, antepartum nml anatomy--but needs f/u Quad--could not get NT for first screen - US MFM OB FOLLOW UP; Future - AFP TETRA  General obstetric precautions including but not limited to vaginal bleeding, contractions, leaking of fluid and fetal movement were reviewed in detail with the patient. Please refer to After Visit Summary for other  counseling recommendations.  No Follow-up on file.   Reva Boresanya S Delton Stelle, MD

## 2017-10-18 NOTE — Patient Instructions (Signed)
 Second Trimester of Pregnancy The second trimester is from week 14 through week 27 (months 4 through 6). The second trimester is often a time when you feel your best. Your body has adjusted to being pregnant, and you begin to feel better physically. Usually, morning sickness has lessened or quit completely, you may have more energy, and you may have an increase in appetite. The second trimester is also a time when the fetus is growing rapidly. At the end of the sixth month, the fetus is about 9 inches long and weighs about 1 pounds. You will likely begin to feel the baby move (quickening) between 16 and 20 weeks of pregnancy. Body changes during your second trimester Your body continues to go through many changes during your second trimester. The changes vary from woman to woman.  Your weight will continue to increase. You will notice your lower abdomen bulging out.  You may begin to get stretch marks on your hips, abdomen, and breasts.  You may develop headaches that can be relieved by medicines. The medicines should be approved by your health care provider.  You may urinate more often because the fetus is pressing on your bladder.  You may develop or continue to have heartburn as a result of your pregnancy.  You may develop constipation because certain hormones are causing the muscles that push waste through your intestines to slow down.  You may develop hemorrhoids or swollen, bulging veins (varicose veins).  You may have back pain. This is caused by: ? Weight gain. ? Pregnancy hormones that are relaxing the joints in your pelvis. ? A shift in weight and the muscles that support your balance.  Your breasts will continue to grow and they will continue to become tender.  Your gums may bleed and may be sensitive to brushing and flossing.  Dark spots or blotches (chloasma, mask of pregnancy) may develop on your face. This will likely fade after the baby is born.  A dark line from  your belly button to the pubic area (linea nigra) may appear. This will likely fade after the baby is born.  You may have changes in your hair. These can include thickening of your hair, rapid growth, and changes in texture. Some women also have hair loss during or after pregnancy, or hair that feels dry or thin. Your hair will most likely return to normal after your baby is born.  What to expect at prenatal visits During a routine prenatal visit:  You will be weighed to make sure you and the fetus are growing normally.  Your blood pressure will be taken.  Your abdomen will be measured to track your baby's growth.  The fetal heartbeat will be listened to.  Any test results from the previous visit will be discussed.  Your health care provider may ask you:  How you are feeling.  If you are feeling the baby move.  If you have had any abnormal symptoms, such as leaking fluid, bleeding, severe headaches, or abdominal cramping.  If you are using any tobacco products, including cigarettes, chewing tobacco, and electronic cigarettes.  If you have any questions.  Other tests that may be performed during your second trimester include:  Blood tests that check for: ? Low iron levels (anemia). ? High blood sugar that affects pregnant women (gestational diabetes) between 24 and 28 weeks. ? Rh antibodies. This is to check for a protein on red blood cells (Rh factor).  Urine tests to check for infections, diabetes,   or protein in the urine.  An ultrasound to confirm the proper growth and development of the baby.  An amniocentesis to check for possible genetic problems.  Fetal screens for spina bifida and Down syndrome.  HIV (human immunodeficiency virus) testing. Routine prenatal testing includes screening for HIV, unless you choose not to have this test.  Follow these instructions at home: Medicines  Follow your health care provider's instructions regarding medicine use. Specific  medicines may be either safe or unsafe to take during pregnancy.  Take a prenatal vitamin that contains at least 600 micrograms (mcg) of folic acid.  If you develop constipation, try taking a stool softener if your health care provider approves. Eating and drinking  Eat a balanced diet that includes fresh fruits and vegetables, whole grains, good sources of protein such as meat, eggs, or tofu, and low-fat dairy. Your health care provider will help you determine the amount of weight gain that is right for you.  Avoid raw meat and uncooked cheese. These carry germs that can cause birth defects in the baby.  If you have low calcium intake from food, talk to your health care provider about whether you should take a daily calcium supplement.  Limit foods that are high in fat and processed sugars, such as fried and sweet foods.  To prevent constipation: ? Drink enough fluid to keep your urine clear or pale yellow. ? Eat foods that are high in fiber, such as fresh fruits and vegetables, whole grains, and beans. Activity  Exercise only as directed by your health care provider. Most women can continue their usual exercise routine during pregnancy. Try to exercise for 30 minutes at least 5 days a week. Stop exercising if you experience uterine contractions.  Avoid heavy lifting, wear low heel shoes, and practice good posture.  A sexual relationship may be continued unless your health care provider directs you otherwise. Relieving pain and discomfort  Wear a good support bra to prevent discomfort from breast tenderness.  Take warm sitz baths to soothe any pain or discomfort caused by hemorrhoids. Use hemorrhoid cream if your health care provider approves.  Rest with your legs elevated if you have leg cramps or low back pain.  If you develop varicose veins, wear support hose. Elevate your feet for 15 minutes, 3-4 times a day. Limit salt in your diet. Prenatal Care  Write down your questions.  Take them to your prenatal visits.  Keep all your prenatal visits as told by your health care provider. This is important. Safety  Wear your seat belt at all times when driving.  Make a list of emergency phone numbers, including numbers for family, friends, the hospital, and police and fire departments. General instructions  Ask your health care provider for a referral to a local prenatal education class. Begin classes no later than the beginning of month 6 of your pregnancy.  Ask for help if you have counseling or nutritional needs during pregnancy. Your health care provider can offer advice or refer you to specialists for help with various needs.  Do not use hot tubs, steam rooms, or saunas.  Do not douche or use tampons or scented sanitary pads.  Do not cross your legs for long periods of time.  Avoid cat litter boxes and soil used by cats. These carry germs that can cause birth defects in the baby and possibly loss of the fetus by miscarriage or stillbirth.  Avoid all smoking, herbs, alcohol, and unprescribed drugs. Chemicals in these products   can affect the formation and growth of the baby.  Do not use any products that contain nicotine or tobacco, such as cigarettes and e-cigarettes. If you need help quitting, ask your health care provider.  Visit your dentist if you have not gone yet during your pregnancy. Use a soft toothbrush to brush your teeth and be gentle when you floss. Contact a health care provider if:  You have dizziness.  You have mild pelvic cramps, pelvic pressure, or nagging pain in the abdominal area.  You have persistent nausea, vomiting, or diarrhea.  You have a bad smelling vaginal discharge.  You have pain when you urinate. Get help right away if:  You have a fever.  You are leaking fluid from your vagina.  You have spotting or bleeding from your vagina.  You have severe abdominal cramping or pain.  You have rapid weight gain or weight  loss.  You have shortness of breath with chest pain.  You notice sudden or extreme swelling of your face, hands, ankles, feet, or legs.  You have not felt your baby move in over an hour.  You have severe headaches that do not go away when you take medicine.  You have vision changes. Summary  The second trimester is from week 14 through week 27 (months 4 through 6). It is also a time when the fetus is growing rapidly.  Your body goes through many changes during pregnancy. The changes vary from woman to woman.  Avoid all smoking, herbs, alcohol, and unprescribed drugs. These chemicals affect the formation and growth your baby.  Do not use any tobacco products, such as cigarettes, chewing tobacco, and e-cigarettes. If you need help quitting, ask your health care provider.  Contact your health care provider if you have any questions. Keep all prenatal visits as told by your health care provider. This is important. This information is not intended to replace advice given to you by your health care provider. Make sure you discuss any questions you have with your health care provider. Document Released: 11/22/2001 Document Revised: 05/05/2016 Document Reviewed: 01/29/2013 Elsevier Interactive Patient Education  2017 [employer]   Preterm Labor and Birth Information The normal length of a pregnancy is 39-41 weeks. Preterm labor is when labor starts before 37 completed weeks of pregnancy. What are the risk factors for preterm labor? Preterm labor is more likely to occur in women who:  Have certain infections during pregnancy such as a bladder infection, sexually transmitted infection, or infection inside the uterus (chorioamnionitis).  Have a shorter-than-normal cervix.  Have gone into preterm labor before.  Have had surgery on their cervix.  Are younger than age 26 or older than age 26.  Are African American.  Are pregnant with twins or multiple babies (multiple  gestation).  Take street drugs or smoke while pregnant.  Do not gain enough weight while pregnant.  Became pregnant shortly after having been pregnant.  What are the symptoms of preterm labor? Symptoms of preterm labor include:  Cramps similar to those that can happen during a menstrual period. The cramps may happen with diarrhea.  Pain in the abdomen or lower back.  Regular uterine contractions that may feel like tightening of the abdomen.  A feeling of increased pressure in the pelvis.  Increased watery or bloody mucus discharge from the vagina.  Water breaking (ruptured amniotic sac).  Why is it important to recognize signs of preterm labor? It is important to recognize signs of preterm labor because babies who  are born prematurely may not be fully developed. This can put them at an increased risk for:  Long-term (chronic) heart and lung problems.  Difficulty immediately after birth with regulating body systems, including blood sugar, body temperature, heart rate, and breathing rate.  Bleeding in the brain.  Cerebral palsy.  Learning difficulties.  Death.  These risks are highest for babies who are born before 34 weeks of pregnancy. How is preterm labor treated? Treatment depends on the length of your pregnancy, your condition, and the health of your baby. It may involve:  Having a stitch (suture) placed in your cervix to prevent your cervix from opening too early (cerclage).  Taking or being given medicines, such as: ? Hormone medicines. These may be given early in pregnancy to help support the pregnancy. ? Medicine to stop contractions. ? Medicines to help mature the baby's lungs. These may be prescribed if the risk of delivery is high. ? Medicines to prevent your baby from developing cerebral palsy.  If the labor happens before 34 weeks of pregnancy, you may need to stay in the hospital. What should I do if I think I am in preterm labor? If you think that you  are going into preterm labor, call your health care provider right away. How can I prevent preterm labor in future pregnancies? To increase your chance of having a full-term pregnancy:  Do not use any tobacco products, such as cigarettes, chewing tobacco, and e-cigarettes. If you need help quitting, ask your health care provider.  Do not use street drugs or medicines that have not been prescribed to you during your pregnancy.  Talk with your health care provider before taking any herbal supplements, even if you have been taking them regularly.  Make sure you gain a healthy amount of weight during your pregnancy.  Watch for infection. If you think that you might have an infection, get it checked right away.  Make sure to tell your health care provider if you have gone into preterm labor before.  This information is not intended to replace advice given to you by your health care provider. Make sure you discuss any questions you have with your health care provider. Document Released: 02/18/2004 Document Revised: 05/10/2016 Document Reviewed: 04/20/2016 Elsevier Interactive Patient Education  2018 [employer]

## 2017-10-20 LAB — AFP TETRA
DIA MOM VALUE: 0.69
DIA Value (EIA): 113.44 pg/mL
DSR (BY AGE) 1 IN: 966
DSR (Second Trimester) 1 IN: 8433
GESTATIONAL AGE AFP: 19.4 wk
MATERNAL AGE AT EDD: 26.3 a
MSAFP Mom: 0.75
MSAFP: 34.5 ng/mL
MSHCG Mom: 1.17
MSHCG: 25521 m[IU]/mL
OSB RISK: 10000
T18 (By Age): 1:3762 {titer}
Test Results:: NEGATIVE
UE3 MOM: 1.03
UE3 VALUE: 1.61 ng/mL
Weight: 178 [lb_av]

## 2017-10-25 ENCOUNTER — Telehealth: Payer: Self-pay | Admitting: Radiology

## 2017-10-25 NOTE — Telephone Encounter (Signed)
paient called stating that she was having mild contractions about every 6/8 mins apart, strong enough that she is having to lie down because of them. Expl[facility]. Patient expressed understanding and stated that she was going to be checked out at the hospital.

## 2017-10-26 ENCOUNTER — Telehealth: Payer: Self-pay | Admitting: *Deleted

## 2017-10-26 NOTE — Telephone Encounter (Signed)
-----   Message from Lindell SparHeather L Bacon, [place] sent at 10/26/2017  3:38 PM EST ----- Regarding: please call patient [facility] leaving her a message yesterday, which she did not get until today because she wasn't working. Anyhow, pt states that she has high blood pressure passed out and had to call EMS bc she could not leave, children coming home from school. EMS shows up states pt may have preeclampsia, pt wants to know if she needs to be seen before the 20th. Pleas call patient to advise

## 2017-10-26 NOTE — Telephone Encounter (Signed)
Called pt to follow up. She states on Sunday 10/22/17 she did have a syncopal episode but not sure what her BP was at that time. Yesterday, 10/25/17, she was pre-syncopal and felt weak. Her BP at that time was 187/80. While on phone with pt, she checked her BP which was 115/72 and she had no symptoms. She states she feels fine today.  Advised pt to call office or go to MAU for eval if she has symptoms or elevated BP in the future as Myrica may not always be available. Pt expressed understanding.

## 2017-11-05 ENCOUNTER — Encounter (HOSPITAL_COMMUNITY): Payer: Self-pay

## 2017-11-05 ENCOUNTER — Inpatient Hospital Stay (HOSPITAL_COMMUNITY)
Admission: AD | Admit: 2017-11-05 | Discharge: 2017-11-05 | Disposition: A | Discharge status: Home / Self Care | Payer: Medicaid Other | Source: Ambulatory Visit | Attending: Obstetrics and Gynecology | Admitting: Obstetrics and Gynecology

## 2017-11-05 DIAGNOSIS — J45909 Unspecified asthma, uncomplicated: Secondary | ICD-10-CM | POA: Insufficient documentation

## 2017-11-05 DIAGNOSIS — F419 Anxiety disorder, unspecified: Secondary | ICD-10-CM | POA: Diagnosis not present

## 2017-11-05 DIAGNOSIS — O99512 Diseases of the respiratory system complicating pregnancy, second trimester: Secondary | ICD-10-CM | POA: Diagnosis not present

## 2017-11-05 DIAGNOSIS — O26892 Other specified pregnancy related conditions, second trimester: Secondary | ICD-10-CM | POA: Insufficient documentation

## 2017-11-05 DIAGNOSIS — O212 Late vomiting of pregnancy: Secondary | ICD-10-CM | POA: Insufficient documentation

## 2017-11-05 DIAGNOSIS — F319 Bipolar disorder, unspecified: Secondary | ICD-10-CM | POA: Diagnosis not present

## 2017-11-05 DIAGNOSIS — K529 Noninfective gastroenteritis and colitis, unspecified: Secondary | ICD-10-CM

## 2017-11-05 DIAGNOSIS — Z3A22 22 weeks gestation of pregnancy: Secondary | ICD-10-CM | POA: Diagnosis not present

## 2017-11-05 DIAGNOSIS — O99342 Other mental disorders complicating pregnancy, second trimester: Secondary | ICD-10-CM | POA: Insufficient documentation

## 2017-11-05 DIAGNOSIS — Z87891 Personal history of nicotine dependence: Secondary | ICD-10-CM | POA: Insufficient documentation

## 2017-11-05 DIAGNOSIS — Z79899 Other long term (current) drug therapy: Secondary | ICD-10-CM | POA: Diagnosis not present

## 2017-11-05 LAB — URINALYSIS, ROUTINE W REFLEX MICROSCOPIC
BILIRUBIN URINE: NEGATIVE
Glucose, UA: NEGATIVE mg/dL
Hgb urine dipstick: NEGATIVE
KETONES UR: 5 mg/dL — AB
Leukocytes, UA: NEGATIVE
Nitrite: NEGATIVE
PH: 7 (ref 5.0–8.0)
Protein, ur: 30 mg/dL — AB
SPECIFIC GRAVITY, URINE: 1.024 (ref 1.005–1.030)

## 2017-11-05 MED ORDER — ONDANSETRON HCL 40 MG/20ML IJ SOLN
8.0000 mg | Freq: Once | INTRAMUSCULAR | Status: AC
  Administered 2017-11-05: 8 mg via INTRAVENOUS
  Filled 2017-11-05: qty 4

## 2017-11-05 MED ORDER — ONDANSETRON HCL 4 MG PO TABS: 4.0000 mg | ORAL_TABLET | Freq: Four times a day (QID) | ORAL | 0 refills | Status: DC

## 2017-11-05 MED ORDER — LACTATED RINGERS IV BOLUS (SEPSIS)
1000.0000 mL | Freq: Once | INTRAVENOUS | Status: AC
  Administered 2017-11-05: 1000 mL via INTRAVENOUS

## 2017-11-05 NOTE — MAU Provider Note (Signed)
Chief Complaint:  Emesis   First Provider Initiated Contact with Patient 11/05/17 1210      HPI: Megan LeatherwoodJennifer Whitehead is a 26 y.o. [id number] at [id number] presents to maternity admissions reporting vomiting since yesterday. She reports that her daughter was sick with a GI bug 2 days ago. She denies abdominal pain and contractions. She reports that she has vomited over 6 times this morning- has taken prescribed phenergan with no relief. She reports good fetal movement, denies LOF, vaginal bleeding, vaginal itching/burning, urinary symptoms, h/a, dizziness, n/v, or fever/chills.    Past Medical History: Past Medical History:  Diagnosis Date  . Anxiety   . Asthma   . Bipolar 1 disorder (HCC)     Past obstetric history: OB History  Gravida Para Term Preterm AB Living  4 3 3     3   SAB TAB Ectopic Multiple Live Births          3    # Outcome Date GA Lbr Len/2nd Weight Sex Delivery Anes PTL Lv  4 Current           3 Term 03/13/15 5968w0d  8 lb 1 oz (3.657 kg) M Vag-Spont EPI  LIV  2 Term 07/04/12 4768w0d  6 lb 11 oz (3.033 kg) F Vag-Spont EPI  LIV  1 Term 10/26/10 8552w0d  7 lb 7 oz (3.374 kg) F Vag-Spont EPI  LIV      Past Surgical History: Past Surgical History:  Procedure Laterality Date  . WISDOM TOOTH EXTRACTION      Family History: Family History  Problem Relation Age of Onset  . Hypertension Mother   . Hypertension Father     Social History: Social History   Tobacco Use  . Smoking status: Former Smoker    Packs/day: 2.00    Types: Cigarettes    Last attempt to quit: 09/14/2016    Years since quitting: 1.1  . Smokeless tobacco: Never Used  Substance Use Topics  . Alcohol use: No  . Drug use: No    Allergies:  Allergies  Allergen Reactions  . Sulfa Antibiotics Hives  . Ibuprofen Hives    Meds:  Medications Prior to Admission  Medication Sig Dispense Refill Last Dose  . Butalbital-APAP-Caffeine 50-325-40 MG capsule Take 1-2 capsules every 6 (six) hours as needed by  mouth for headache. 30 capsule 0 Taking  . Pediatric Multiple Vit-C-FA (FLINSTONES GUMMIES OMEGA-3 DHA) CHEW Chew by mouth.   Taking  . promethazine (PHENERGAN) 12.5 MG tablet Take 1 tablet (12.5 mg total) by mouth every 6 (six) hours as needed for nausea or vomiting. 30 tablet 0 Taking    ROS:  Review of Systems  Constitutional: Negative for chills, diaphoresis, fatigue and fever.  Respiratory: Negative for cough, chest tightness, shortness of breath and wheezing.   Gastrointestinal: Positive for vomiting. Negative for abdominal pain, constipation, diarrhea and nausea.  Genitourinary: Negative for difficulty urinating, dysuria, flank pain, frequency, pelvic pain, urgency, vaginal bleeding, vaginal discharge and vaginal pain.  All other systems reviewed and are negative.  I have reviewed patient's Past Medical Hx, Surgical Hx, Family Hx, Social Hx, medications and allergies.   Physical Exam   Patient Vitals for the past 24 hrs:  BP Temp Temp src Pulse Resp  11/05/17 1158 131/74 98.3 F (36.8 C) Oral (!) 119 18   Constitutional: Well-developed, well-nourished female in no acute distress.  Cardiovascular: normal rate Respiratory: normal effort GI: Abd soft, non-tender, gravid appropriate for gestational age.  MS: Extremities nontender, no  edema, normal ROM Neurologic: Alert and oriented x 4.  GU: Neg CVAT.    FHT 152 by doppler    Labs: Results for orders placed or performed during the hospital encounter of 11/05/17 (from the past 24 hour(s))  Urinalysis, Routine w reflex microscopic     Status: Abnormal   Collection Time: 11/05/17 11:54 AM  Result Value Ref Range   Color, Urine YELLOW YELLOW   APPearance CLEAR CLEAR   Specific Gravity, Urine 1.024 1.005 - 1.030   pH 7.0 5.0 - 8.0   Glucose, UA NEGATIVE NEGATIVE mg/dL   Hgb urine dipstick NEGATIVE NEGATIVE   Bilirubin Urine NEGATIVE NEGATIVE   Ketones, ur 5 (A) NEGATIVE mg/dL   Protein, ur 30 (A) NEGATIVE mg/dL   Nitrite  NEGATIVE NEGATIVE   Leukocytes, UA NEGATIVE NEGATIVE   RBC / HPF 0-5 0 - 5 RBC/hpf   WBC, UA 0-5 0 - 5 WBC/hpf   Bacteria, UA RARE (A) NONE SEEN   Squamous Epithelial / LPF 0-5 (A) NONE SEEN   Mucus PRESENT    AB/Positive/-- (08/31 0000)   MAU Course/MDM: I have ordered labs and reviewed results for Urinalysis Treatments in MAU included 1,03700ml Lactated ringers IV, Zofran 8mg  in NaCL IV.   Pt discharged   Assessment: 1. Acute gastroenteritis     Plan: Discharge home Rx Zofran 4mg  PRN for N/V  Follow up as scheduled in the office and return to MAU as needed for emergencies   Allergies as of 11/05/2017      Reactions   Sulfa Antibiotics Hives   Ibuprofen Hives      Medication List    TAKE these medications   Butalbital-APAP-Caffeine 50-325-40 MG capsule Take 1-2 capsules every 6 (six) hours as needed by mouth for headache.   FLINSTONES GUMMIES OMEGA-3 DHA Chew Chew 2 each by mouth daily.   ondansetron 4 MG tablet Commonly known as:  ZOFRAN Take 1 tablet (4 mg total) by mouth every 6 (six) hours.   promethazine 12.5 MG tablet Commonly known as:  PHENERGAN Take 1 tablet (12.5 mg total) by mouth every 6 (six) hours as needed for nausea or vomiting.       Steward DroneVeronica Arias Weinert Certified Nurse-Midwife 11/05/2017 12:22 PM

## 2017-11-05 NOTE — MAU Note (Signed)
Vomiting since yesterday, denies diarrhea. Feels weak.

## 2017-11-08 ENCOUNTER — Other Ambulatory Visit: Payer: Self-pay | Admitting: Obstetrics & Gynecology

## 2017-11-08 ENCOUNTER — Other Ambulatory Visit (INDEPENDENT_AMBULATORY_CARE_PROVIDER_SITE_OTHER): Payer: Medicaid Other | Admitting: *Deleted

## 2017-11-08 DIAGNOSIS — R519 Headache, unspecified: Secondary | ICD-10-CM

## 2017-11-08 DIAGNOSIS — R300 Dysuria: Secondary | ICD-10-CM

## 2017-11-08 DIAGNOSIS — R51 Headache: Principal | ICD-10-CM

## 2017-11-08 LAB — POCT URINALYSIS DIPSTICK
Bilirubin, UA: NEGATIVE
GLUCOSE UA: NEGATIVE
Ketones, UA: NEGATIVE
SPEC GRAV UA: 1.02 (ref 1.010–1.025)
Urobilinogen, UA: >=8 E.U./dL — AB
pH, UA: 6 (ref 5.0–8.0)

## 2017-11-08 MED ORDER — CEPHALEXIN 500 MG PO CAPS: 500.0000 mg | ORAL_CAPSULE | Freq: Three times a day (TID) | ORAL | 0 refills | Status: DC

## 2017-11-08 NOTE — Progress Notes (Signed)
Attestation of Attending Supervision of Advanced Practitioner: Evaluation and management procedures were performed by the CMA under my supervision.  I have reviewed the CMA's note and chart, and I agree with the management and plan. I was consulted at the point of care with questions  Federico FlakeKimberly Niles Britt Petroni, MD, MPH, ABFM Attending Physician [facility]

## 2017-11-08 NOTE — Progress Notes (Signed)
SUBJECTIVE: Natalia LeatherwoodJennifer Verbeek is a 26 y.o. female who complains of urinary frequency, urgency and dysuria x 2 days, without flank pain, fever, chills, or abnormal vaginal discharge or bleeding.   OBJECTIVE: Appears well, in no apparent distress.  Vital signs are normal. Urine dipstick shows positive for WBC's, positive for RBC's, positive for protein, positive for nitrates and positive for urobilinogen.    ASSESSMENT: Dysuria  PLAN: Treatment per orders.  Call or return to clinic prn if these symptoms worsen or fail to improve as anticipated.

## 2017-11-09 ENCOUNTER — Inpatient Hospital Stay (HOSPITAL_COMMUNITY)
Admission: AD | Admit: 2017-11-09 | Discharge: 2017-11-09 | Disposition: A | Discharge status: Home / Self Care | Payer: Medicaid Other | Source: Ambulatory Visit | Attending: Obstetrics & Gynecology | Admitting: Obstetrics & Gynecology

## 2017-11-09 ENCOUNTER — Encounter (HOSPITAL_COMMUNITY): Payer: Self-pay

## 2017-11-09 ENCOUNTER — Other Ambulatory Visit: Payer: Self-pay

## 2017-11-09 DIAGNOSIS — Z882 Allergy status to sulfonamides status: Secondary | ICD-10-CM | POA: Diagnosis not present

## 2017-11-09 DIAGNOSIS — O99612 Diseases of the digestive system complicating pregnancy, second trimester: Secondary | ICD-10-CM | POA: Insufficient documentation

## 2017-11-09 DIAGNOSIS — O9989 Other specified diseases and conditions complicating pregnancy, childbirth and the puerperium: Secondary | ICD-10-CM

## 2017-11-09 DIAGNOSIS — Z886 Allergy status to analgesic agent status: Secondary | ICD-10-CM | POA: Insufficient documentation

## 2017-11-09 DIAGNOSIS — A084 Viral intestinal infection, unspecified: Secondary | ICD-10-CM | POA: Diagnosis not present

## 2017-11-09 DIAGNOSIS — Z87891 Personal history of nicotine dependence: Secondary | ICD-10-CM | POA: Insufficient documentation

## 2017-11-09 DIAGNOSIS — R112 Nausea with vomiting, unspecified: Secondary | ICD-10-CM | POA: Diagnosis present

## 2017-11-09 DIAGNOSIS — Z8249 Family history of ischemic heart disease and other diseases of the circulatory system: Secondary | ICD-10-CM | POA: Insufficient documentation

## 2017-11-09 DIAGNOSIS — Z3A22 22 weeks gestation of pregnancy: Secondary | ICD-10-CM | POA: Diagnosis not present

## 2017-11-09 LAB — URINALYSIS, ROUTINE W REFLEX MICROSCOPIC
Bilirubin Urine: NEGATIVE
GLUCOSE, UA: NEGATIVE mg/dL
Hgb urine dipstick: NEGATIVE
Ketones, ur: 80 mg/dL — AB
Nitrite: NEGATIVE
PROTEIN: NEGATIVE mg/dL
Specific Gravity, Urine: 1.02 (ref 1.005–1.030)
pH: 6 (ref 5.0–8.0)

## 2017-11-09 MED ORDER — LACTATED RINGERS IV BOLUS (SEPSIS)
1000.0000 mL | Freq: Once | INTRAVENOUS | Status: AC
  Administered 2017-11-09: 1000 mL via INTRAVENOUS

## 2017-11-09 MED ORDER — FAMOTIDINE IN NACL 20-0.9 MG/50ML-% IV SOLN
20.0000 mg | Freq: Once | INTRAVENOUS | Status: AC
  Administered 2017-11-09: 20 mg via INTRAVENOUS
  Filled 2017-11-09: qty 50

## 2017-11-09 MED ORDER — ONDANSETRON HCL 4 MG/2ML IJ SOLN
4.0000 mg | Freq: Once | INTRAMUSCULAR | Status: AC
  Administered 2017-11-09: 4 mg via INTRAVENOUS
  Filled 2017-11-09: qty 2

## 2017-11-09 NOTE — MAU Provider Note (Signed)
History     CSN: [id number]  Arrival date and time: 11/09/17 1727   First Provider Initiated Contact with Patient 11/09/17 1808      Chief Complaint  Patient presents with  . Cough  . Dizziness   HPI  Ms. [name] is a 26 y.o. [phone] at 8540w5d who presents to MAU today with complaint of N/V x 4 days and blood in emesis today. The patient was seen in MAU when vomiting started a few days ago. She states that she received IV fluids and was given Zofran and felt better. She had a known exposure - daughter had GI bug. She states that this afternoon she ate a [place] sandwich with cheese and goldfish and then had 1 episode of "a lot" of emesis and noted some blood in in. She denies diarrhea, abdominal pain, fever, vaginal bleeding, LOF or contractions. She reports normal fetal movement.   OB History    Gravida Para Term Preterm AB Living   4 3 3     3    SAB TAB Ectopic Multiple Live Births           3      Past Medical History:  Diagnosis Date  . Anxiety   . Asthma   . Bipolar 1 disorder [facility](HCC)     Past Surgical History:  Procedure Laterality Date  . WISDOM TOOTH EXTRACTION      Family History  Problem Relation Age of Onset  . Hypertension Mother   . Hypertension Father     Social History   Tobacco Use  . Smoking status: Former Smoker    Packs/day: 2.00    Types: Cigarettes    Last attempt to quit: 09/14/2016    Years since quitting: 1.1  . Smokeless tobacco: Never Used  Substance Use Topics  . Alcohol use: No  . Drug use: No    Allergies:  Allergies  Allergen Reactions  . Sulfa Antibiotics Hives  . Ibuprofen Hives    Medications Prior to Admission  Medication Sig Dispense Refill Last Dose  . Butalbital-APAP-Caffeine 50-325-40 MG capsule TAKE 1 TO 2 CAPSULES BY MOUTH EVERY 6 HOURS AS NEEDED FOR HEADACHE 30 capsule 2   . cephALEXin (KEFLEX) 500 MG capsule Take 1 capsule (500 mg total) by mouth 3 (three) times daily. 21 capsule 0   . ondansetron  (ZOFRAN) 4 MG tablet Take 1 tablet (4 mg total) by mouth every 6 (six) hours. 8 tablet 0   . Pediatric Multiple Vit-C-FA (FLINSTONES GUMMIES OMEGA-3 DHA) CHEW Chew 2 each by mouth daily.    Past Month at Unknown time  . promethazine (PHENERGAN) 12.5 MG tablet Take 1 tablet (12.5 mg total) by mouth every 6 (six) hours as needed for nausea or vomiting. 30 tablet 0 Past Week at Unknown time    Review of Systems  Constitutional: Negative for fever.  Gastrointestinal: Positive for nausea and vomiting. Negative for abdominal pain, constipation and diarrhea.  Genitourinary: Negative for dysuria, frequency, urgency, vaginal bleeding and vaginal discharge.   Physical Exam   Blood pressure 121/72, pulse 89, temperature 98 F (36.7 C), temperature source Oral, resp. rate 18, last menstrual period 06/12/2017.  Physical Exam  Nursing note and vitals reviewed. Constitutional: She is oriented to person, place, and time. She appears well-developed and well-nourished. No distress.  HENT:  Head: Normocephalic and atraumatic.  Cardiovascular: Normal rate.  Respiratory: Effort normal.  GI: Soft. She exhibits no distension and no mass. There is no tenderness. There is  no rebound and no guarding.  Neurological: She is alert and oriented to person, place, and time.  Skin: Skin is warm and dry. No erythema.  Psychiatric: She has a normal mood and affect.   Results for orders placed or performed during the hospital encounter of 11/09/17 (from the past 24 hour(s))  Urinalysis, Routine w reflex microscopic     Status: Abnormal   Collection Time: 11/09/17  6:24 PM  Result Value Ref Range   Color, Urine YELLOW YELLOW   APPearance HAZY (A) CLEAR   Specific Gravity, Urine 1.020 1.005 - 1.030   pH 6.0 5.0 - 8.0   Glucose, UA NEGATIVE NEGATIVE mg/dL   Hgb urine dipstick NEGATIVE NEGATIVE   Bilirubin Urine NEGATIVE NEGATIVE   Ketones, ur 80 (A) NEGATIVE mg/dL   Protein, ur NEGATIVE NEGATIVE mg/dL   Nitrite  NEGATIVE NEGATIVE   Leukocytes, UA MODERATE (A) NEGATIVE   RBC / HPF 0-5 0 - 5 RBC/hpf   WBC, UA 6-30 0 - 5 WBC/hpf   Bacteria, UA RARE (A) NONE SEEN   Squamous Epithelial / LPF 6-30 (A) NONE SEEN   Mucus PRESENT     MAU Course  Procedures None  MDM FHR - 140 bpm UA today  IV LR bolus, 4 mg IV Zofran and 20 mg IV Pepcid given  Patient reports some improvement in symptoms. No emesis noted while in MAU. Able to tolerate PO fluids.   Assessment and Plan  A: SIUP at 2078w5d Viral gastroenteritis   P:  Discharge home Continue Zofran for N/V PRN as previously prescribed Second trimester precautions discussed Patient advised to follow-up with [facility] as scheduled for routine prenatal care or sooner PRN Patient may return to MAU as needed or if her condition were to change or worsen   Vonzella NippleJulie Wenzel, PA-C 11/09/2017, 7:42 PM

## 2017-11-09 NOTE — Discharge Instructions (Signed)
Food Choices to Help Relieve Diarrhea, Adult When you have diarrhea, the foods you eat and your eating habits are very important. Choosing the right foods and drinks can help:  Relieve diarrhea.  Replace lost fluids and nutrients.  Prevent dehydration.  What general guidelines should I follow? Relieving diarrhea  Choose foods with less than 2 g or .07 oz. of fiber per serving.  Limit fats to less than 8 tsp (38 g or 1.34 oz.) a day.  Avoid the following: ? Foods and beverages sweetened with high-fructose corn syrup, honey, or sugar alcohols such as xylitol, sorbitol, and mannitol. ? Foods that contain a lot of fat or sugar. ? Fried, greasy, or spicy foods. ? High-fiber grains, breads, and cereals. ? Raw fruits and vegetables.  Eat foods that are rich in probiotics. These foods include dairy products such as yogurt and fermented milk products. They help increase healthy bacteria in the stomach and intestines (gastrointestinal tract, or GI tract).  If you have lactose intolerance, avoid dairy products. These may make your diarrhea worse.  Take medicine to help stop diarrhea (antidiarrheal medicine) only as told by your health care provider. Replacing nutrients  Eat small meals or snacks every 3-4 hours.  Eat bland foods, such as white rice, toast, or baked potato, until your diarrhea starts to get better. Gradually reintroduce nutrient-rich foods as tolerated or as told by your health care provider. This includes: ? Well-cooked protein foods. ? Peeled, seeded, and soft-cooked fruits and vegetables. ? Low-fat dairy products.  Take vitamin and mineral supplements as told by your health care provider. Preventing dehydration   Start by sipping water or a special solution to prevent dehydration (oral rehydration solution, ORS). Urine that is clear or pale yellow means that you are getting enough fluid.  Try to drink at least 8-10 cups of fluid each day to help replace lost  fluids.  You may add other liquids in addition to water, such as clear juice or decaffeinated sports drinks, as tolerated or as told by your health care provider.  Avoid drinks with caffeine, such as coffee, tea, or soft drinks.  Avoid alcohol. What foods are recommended? The items listed may not be a complete list. Talk with your health care provider about what dietary choices are best for you. Grains White rice. White, [place], or pita breads (fresh or toasted), including plain rolls, buns, or bagels. White pasta. Saltine, soda, or graham crackers. Pretzels. Low-fiber cereal. Cooked cereals made with water (such as cornmeal, farina, or cream cereals). Plain muffins. Matzo. Melba toast. Zwieback. Vegetables Potatoes (without the skin). Most well-cooked and canned vegetables without skins or seeds. Tender lettuce. Fruits Apple sauce. Fruits canned in juice. Cooked apricots, cherries, grapefruit, peaches, pears, or plums. Fresh bananas and cantaloupe. Meats and other protein foods Baked or boiled chicken. Eggs. Tofu. Fish. Seafood. Smooth nut butters. Ground or well-cooked tender beef, ham, veal, lamb, pork, or poultry. Dairy Plain yogurt, kefir, and unsweetened liquid yogurt. Lactose-free milk, buttermilk, skim milk, or soy milk. Low-fat or nonfat hard cheese. Beverages Water. Low-calorie sports drinks. Fruit juices without pulp. Strained tomato and vegetable juices. Decaffeinated teas. Sugar-free beverages not sweetened with sugar alcohols. Oral rehydration solutions, if approved by your health care provider. Seasoning and other foods Bouillon, broth, or soups made from recommended foods. What foods are not recommended? The items listed may not be a complete list. Talk with your health care provider about what dietary choices are best for you. Grains Whole grain, whole wheat,  bran, or rye breads, rolls, pastas, and crackers. Wild or brown rice. Whole grain or bran cereals. Barley. Oats and  oatmeal. Corn tortillas or taco shells. Granola. Popcorn. Vegetables Raw vegetables. Fried vegetables. Cabbage, broccoli, Brussels sprouts, artichokes, baked beans, beet greens, corn, kale, legumes, peas, sweet potatoes, and yams. Potato skins. Cooked spinach and cabbage. Fruits Dried fruit, including raisins and dates. Raw fruits. Stewed or dried prunes. Canned fruits with syrup. Meat and other protein foods Fried or fatty meats. Deli meats. Chunky nut butters. Nuts and seeds. Beans and lentils. Berniece Salines. Hot dogs. Sausage. Dairy High-fat cheeses. Whole milk, chocolate milk, and beverages made with milk, such as milk shakes. Half-and-half. Cream. sour cream. Ice cream. Beverages Caffeinated beverages (such as coffee, tea, soda, or energy drinks). Alcoholic beverages. Fruit juices with pulp. Prune juice. Soft drinks sweetened with high-fructose corn syrup or sugar alcohols. High-calorie sports drinks. Fats and oils Butter. Cream sauces. Margarine. Salad oils. Plain salad dressings. Olives. Avocados. Mayonnaise. Sweets and desserts Sweet rolls, doughnuts, and sweet breads. Sugar-free desserts sweetened with sugar alcohols such as xylitol and sorbitol. Seasoning and other foods Honey. Hot sauce. Chili powder. Gravy. Cream-based or milk-based soups. Pancakes and waffles. Summary  When you have diarrhea, the foods you eat and your eating habits are very important.  Make sure you get at least 8-10 cups of fluid each day, or enough to keep your urine clear or pale yellow.  Eat bland foods and gradually reintroduce healthy, nutrient-rich foods as tolerated, or as told by your health care provider.  Avoid high-fiber, fried, greasy, or spicy foods. This information is not intended to replace advice given to you by your health care provider. Make sure you discuss any questions you have with your health care provider. Document Released: 02/18/2004 Document Revised: 11/25/2016 Document Reviewed:  11/25/2016 Elsevier Interactive Patient Education  2017 Elsevier Inc.   Viral Gastroenteritis, Adult Viral gastroenteritis is also known as the stomach flu. This condition is caused by certain germs (viruses). These germs can be passed from person to person very easily (are very contagious). This condition can cause sudden watery poop (diarrhea), fever, and throwing up (vomiting). Having watery poop and throwing up can make you feel weak and cause you to get dehydrated. Dehydration can make you tired and thirsty, make you have a dry mouth, and make it so you pee (urinate) less often. Older adults and people with other diseases or a weak defense system (immune system) are at higher risk for dehydration. It is important to replace the fluids that you lose from having watery poop and throwing up. Follow these instructions at home: Follow instructions from your doctor about how to care for yourself at home. Eating and drinking  Follow these instructions as told by your doctor:  Take an oral rehydration solution (ORS). This is a drink that is sold at pharmacies and stores.  Drink clear fluids in small amounts as you are able, such as: ? Water. ? Ice chips. ? Diluted fruit juice. ? Low-calorie sports drinks.  Eat bland, easy-to-digest foods in small amounts as you are able, such as: ? Bananas. ? Applesauce. ? Rice. ? Low-fat (lean) meats. ? Toast. ? Crackers.  Avoid fluids that have a lot of sugar or caffeine in them.  Avoid alcohol.  Avoid spicy or fatty foods.  General instructions  Drink enough fluid to keep your pee (urine) clear or pale yellow.  Wash your hands often. If you cannot use soap and water, use hand sanitizer.  Make sure that all people in your home wash their hands well and often.  Rest at home while you get better.  Take over-the-counter and prescription medicines only as told by your doctor.  Watch your condition for any changes.  Take a warm bath to  help with any burning or pain from having watery poop.  Keep all follow-up visits as told by your doctor. This is important. Contact a doctor if:  You cannot keep fluids down.  Your symptoms get worse.  You have new symptoms.  You feel light-headed or dizzy.  You have muscle cramps. Get help right away if:  You have chest pain.  You feel very weak or you pass out (faint).  You see blood in your throw-up.  Your throw-up looks like coffee grounds.  You have bloody or black poop (stools) or poop that look like tar.  You have a very bad headache, a stiff neck, or both.  You have a rash.  You have very bad pain, cramping, or bloating in your belly (abdomen).  You have trouble breathing.  You are breathing very quickly.  Your heart is beating very quickly.  Your skin feels cold and clammy.  You feel confused.  You have pain when you pee.  You have signs of dehydration, such as: ? Dark pee, hardly any pee, or no pee. ? Cracked lips. ? Dry mouth. ? Sunken eyes. ? Sleepiness. ? Weakness. This information is not intended to replace advice given to you by your health care provider. Make sure you discuss any questions you have with your health care provider. Document Released: 05/16/2008 Document Revised: 06/17/2016 Document Reviewed: 08/04/2015 Elsevier Interactive Patient Education  2017 [employer]

## 2017-11-09 NOTE — MAU Note (Addendum)
Pt presents to MAU with c/o of productive cough and vomiting that started a week ago, today the mucous was blood tinged. Pt has had dizziness today and states that she "feels confused and weird." Pt denies vaginal bleeding and discharge. +FM

## 2017-11-10 LAB — CULTURE, OB URINE

## 2017-11-10 LAB — URINE CULTURE, OB REFLEX

## 2017-11-15 ENCOUNTER — Ambulatory Visit (INDEPENDENT_AMBULATORY_CARE_PROVIDER_SITE_OTHER): Payer: Medicaid Other | Admitting: Obstetrics & Gynecology

## 2017-11-15 ENCOUNTER — Other Ambulatory Visit (HOSPITAL_COMMUNITY)
Admission: RE | Admit: 2017-11-15 | Discharge: 2017-11-15 | Disposition: A | Discharge status: Home / Self Care | Payer: Medicaid Other | Source: Ambulatory Visit | Attending: Obstetrics & Gynecology | Admitting: Obstetrics & Gynecology

## 2017-11-15 VITALS — BP 118/75 | HR 122

## 2017-11-15 DIAGNOSIS — O26892 Other specified pregnancy related conditions, second trimester: Secondary | ICD-10-CM

## 2017-11-15 DIAGNOSIS — N898 Other specified noninflammatory disorders of vagina: Secondary | ICD-10-CM

## 2017-11-15 DIAGNOSIS — O09299 Supervision of pregnancy with other poor reproductive or obstetric history, unspecified trimester: Secondary | ICD-10-CM

## 2017-11-15 DIAGNOSIS — B9689 Other specified bacterial agents as the cause of diseases classified elsewhere: Secondary | ICD-10-CM | POA: Insufficient documentation

## 2017-11-15 DIAGNOSIS — B373 Candidiasis of vulva and vagina: Secondary | ICD-10-CM | POA: Insufficient documentation

## 2017-11-15 DIAGNOSIS — Z3480 Encounter for supervision of other normal pregnancy, unspecified trimester: Secondary | ICD-10-CM

## 2017-11-15 DIAGNOSIS — R55 Syncope and collapse: Secondary | ICD-10-CM

## 2017-11-15 DIAGNOSIS — R51 Headache: Secondary | ICD-10-CM

## 2017-11-15 DIAGNOSIS — R42 Dizziness and giddiness: Secondary | ICD-10-CM

## 2017-11-15 DIAGNOSIS — R519 Headache, unspecified: Secondary | ICD-10-CM

## 2017-11-15 DIAGNOSIS — Z3482 Encounter for supervision of other normal pregnancy, second trimester: Secondary | ICD-10-CM

## 2017-11-15 MED ORDER — TERCONAZOLE 0.4 % VA CREA
1.0000 | TOPICAL_CREAM | Freq: Every day | VAGINAL | 0 refills | Status: DC
Start: 2017-11-15 — End: 2018-01-22

## 2017-11-15 MED ORDER — BUTALBITAL-APAP-CAFFEINE 50-325-40 MG PO CAPS: ORAL_CAPSULE | ORAL | 4 refills | Status: DC

## 2017-11-15 NOTE — Patient Instructions (Signed)
Return to clinic for any scheduled appointments or obstetric concerns, or go to MAU for evaluation  

## 2017-11-15 NOTE — Progress Notes (Signed)
   PRENATAL VISIT NOTE  Subjective:  [name] is a 26 y.o. G4P3003 at 5221w4d being seen today for ongoing prenatal care.  She is currently monitored for the following issues for this low-risk pregnancy and has Supervision of other normal pregnancy, antepartum; History of pre-eclampsia in prior pregnancy, currently pregnant; Anxiety disorder affecting pregnancy, antepartum; and Major depressive disorder on their problem list.  Patient reports feeling like she will faint, gets episodes like this but resolve with rest and hydration. No cardiac symptoms. Also reports "cottage-cheese" like vaginal discharge, causing irritation. Thinks it is yeast infection, wants medication. Also wants refill of Fioricet for headaches.  Contractions: Not present. Vag. Bleeding: None.  Movement: Present. Denies leaking of fluid.   The following portions of the patient's history were reviewed and updated as appropriate: allergies, current medications, past family history, past medical history, past social history, past surgical history and problem list. Problem list updated.  Objective:   Vitals:   11/15/17 0909  BP: 118/75  Pulse: (!) 122    Fetal Status: Fetal Heart Rate (bpm): 148 Fundal Height: 24 cm Movement: Present     General:  Alert, oriented and cooperative. Patient is in no acute distress.  Skin: Skin is warm and dry. No rash noted.   Cardiovascular: Normal heart rate noted  Respiratory: Normal respiratory effort, no problems with respiration noted  Abdomen: Soft, gravid, appropriate for gestational age.  Pain/Pressure: Absent     Pelvic: Cervical exam deferred as per patient's preference.         Extremities: Normal range of motion.  Edema: None  Mental Status:  Normal mood and affect. Normal behavior. Normal judgment and thought content.   Assessment and Plan:  Pregnancy: G4P3003 at 2821w4d  1. Vaginal discharge during pregnancy in second trimester Self-swab done today, will follow up  results and manage accordingly. Will presumptively treat for yeast.  - terconazole (TERAZOL 7) 0.4 % vaginal cream; Place 1 applicator vaginally at bedtime. Use for seven days  Dispense: 45 g; Refill: 0 - Cervicovaginal ancillary only  2. Postural dizziness with presyncope - POCT Glucose (CBG) was 169 Felt better after hydrating and lying down in office Advised to keep hydrated, syncope precautions given. No cardiac symptoms.  3. History of pre-eclampsia in prior pregnancy, currently pregnant Stable BP.  4. Headache in pregnancy, antepartum, second trimester - Butalbital-APAP-Caffeine 50-325-40 MG capsule; TAKE 1 TO 2 CAPSULES BY MOUTH EVERY 6 HOURS AS NEEDED FOR HEADACHE  Dispense: 30 capsule; Refill: 4  5. Supervision of other normal pregnancy, antepartum Preterm labor symptoms and general obstetric precautions including but not limited to vaginal bleeding, contractions, leaking of fluid and fetal movement were reviewed in detail with the patient. Please refer to After Visit Summary for other counseling recommendations.  Return in about 4 weeks (around 12/13/2017) for 2 hr GTT, TDap, 3rd trimester labs, OB Visit.   Jaynie CollinsUgonna Jailin Moomaw, MD

## 2017-11-16 LAB — CERVICOVAGINAL ANCILLARY ONLY
Bacterial vaginitis: POSITIVE — AB
Candida vaginitis: POSITIVE — AB
Chlamydia: NEGATIVE
Neisseria Gonorrhea: NEGATIVE
Trichomonas: NEGATIVE

## 2017-11-17 ENCOUNTER — Other Ambulatory Visit: Payer: Self-pay | Admitting: Obstetrics & Gynecology

## 2017-11-17 DIAGNOSIS — N760 Acute vaginitis: Principal | ICD-10-CM

## 2017-11-17 DIAGNOSIS — B3731 Acute candidiasis of vulva and vagina: Secondary | ICD-10-CM

## 2017-11-17 DIAGNOSIS — B373 Candidiasis of vulva and vagina: Secondary | ICD-10-CM

## 2017-11-17 DIAGNOSIS — B9689 Other specified bacterial agents as the cause of diseases classified elsewhere: Secondary | ICD-10-CM

## 2017-11-17 MED ORDER — METRONIDAZOLE 500 MG PO TABS: 500.0000 mg | ORAL_TABLET | Freq: Two times a day (BID) | ORAL | 0 refills | Status: DC

## 2017-11-17 MED ORDER — FLUCONAZOLE 150 MG PO TABS: 150.0000 mg | ORAL_TABLET | Freq: Once | ORAL | 3 refills | Status: AC

## 2017-11-23 ENCOUNTER — Other Ambulatory Visit: Payer: Self-pay | Admitting: Family Medicine

## 2017-11-23 ENCOUNTER — Ambulatory Visit (HOSPITAL_COMMUNITY)
Admission: RE | Admit: 2017-11-23 | Discharge: 2017-11-23 | Disposition: A | Discharge status: Home / Self Care | Payer: Medicaid Other | Source: Ambulatory Visit | Attending: Family Medicine | Admitting: Family Medicine

## 2017-11-23 DIAGNOSIS — O99342 Other mental disorders complicating pregnancy, second trimester: Secondary | ICD-10-CM | POA: Insufficient documentation

## 2017-11-23 DIAGNOSIS — Z3480 Encounter for supervision of other normal pregnancy, unspecified trimester: Secondary | ICD-10-CM

## 2017-11-23 DIAGNOSIS — O99340 Other mental disorders complicating pregnancy, unspecified trimester: Secondary | ICD-10-CM

## 2017-11-23 DIAGNOSIS — Z3A24 24 weeks gestation of pregnancy: Secondary | ICD-10-CM | POA: Diagnosis not present

## 2017-11-23 DIAGNOSIS — Z362 Encounter for other antenatal screening follow-up: Secondary | ICD-10-CM

## 2017-11-23 DIAGNOSIS — J45909 Unspecified asthma, uncomplicated: Secondary | ICD-10-CM | POA: Diagnosis not present

## 2017-11-23 DIAGNOSIS — F319 Bipolar disorder, unspecified: Secondary | ICD-10-CM | POA: Insufficient documentation

## 2017-11-23 DIAGNOSIS — O09892 Supervision of other high risk pregnancies, second trimester: Secondary | ICD-10-CM | POA: Diagnosis not present

## 2017-11-23 DIAGNOSIS — O99512 Diseases of the respiratory system complicating pregnancy, second trimester: Secondary | ICD-10-CM | POA: Diagnosis present

## 2017-11-28 ENCOUNTER — Telehealth: Payer: Self-pay

## 2017-11-28 ENCOUNTER — Ambulatory Visit (INDEPENDENT_AMBULATORY_CARE_PROVIDER_SITE_OTHER): Payer: Medicaid Other | Admitting: Obstetrics and Gynecology

## 2017-11-28 ENCOUNTER — Other Ambulatory Visit: Payer: Medicaid Other

## 2017-11-28 VITALS — BP 117/68 | HR 102 | Wt 183.0 lb

## 2017-11-28 DIAGNOSIS — O26899 Other specified pregnancy related conditions, unspecified trimester: Secondary | ICD-10-CM

## 2017-11-28 DIAGNOSIS — R300 Dysuria: Secondary | ICD-10-CM

## 2017-11-28 LAB — POCT URINALYSIS DIPSTICK
Bilirubin, UA: NEGATIVE
GLUCOSE UA: NEGATIVE
NITRITE UA: NEGATIVE
SPEC GRAV UA: 1.025 (ref 1.010–1.025)
Urobilinogen, UA: 2 E.U./dL — AB
pH, UA: 6.5 (ref 5.0–8.0)

## 2017-11-28 MED ORDER — NITROFURANTOIN MONOHYD MACRO 100 MG PO CAPS: 100.0000 mg | ORAL_CAPSULE | Freq: Two times a day (BID) | ORAL | 1 refills | Status: DC

## 2017-11-28 NOTE — Telephone Encounter (Signed)
Patient would like something called in for her severe back pain. OTC are no working for her.

## 2017-11-28 NOTE — Progress Notes (Signed)
Prenatal Visit Note-Work In Visit Date: 11/28/2017 Clinic: [facility]  Subjective:  Megan LeatherwoodJennifer Whitehead is a 26 y.o. [phone] at 3137w3d being seen today for ongoing prenatal care.  She is currently monitored for the following issues for this low-risk pregnancy and has Supervision of other normal pregnancy, antepartum; History of pre-eclampsia in prior pregnancy, currently pregnant; Anxiety disorder affecting pregnancy, antepartum; and Major depressive disorder on their problem list.  Patient reports she's had some low back pain and pelvic pain. She also has had some hematuria and increased urinary frequency.  Contractions: Not present. Vag. Bleeding: None.  Movement: Present. Denies leaking of fluid.   The following portions of the patient's history were reviewed and updated as appropriate: allergies, current medications, past family history, past medical history, past social history, past surgical history and problem list. Problem list updated.  Objective:   Vitals:   11/28/17 1614  BP: 117/68  Pulse: (!) 102  Weight: 183 lb (83 kg)    Fetal Status:     Movement: Present     General:  Alert, oriented and cooperative. Patient is in no acute distress.  Skin: Skin is warm and dry. No rash noted.   Cardiovascular: Normal heart rate noted  Respiratory: Normal respiratory effort, no problems with respiration noted  Abdomen: Soft, gravid, appropriate for gestational age. Pain/Pressure: Absent     Pelvic:  Cervical exam performed 1cm externally.  Dilation: Closed Effacement (%): Thick Station: Ballotable  Extremities: Normal range of motion.     Mental Status: Normal mood and affect. Normal behavior. Normal judgment and thought content.  EGBUS normal Vaginal vault: negative, no blood Cervix: visually closed, no bleeding No CVAT  Urinalysis:     Small ketones, large blood Assessment and Plan:  Pregnancy: G4P3003 at 2637w3d  1. Dysuria in pregnancy, unspecified  trimester Pt states she didn't finish the keflex course she got at the end of November. Will re-culture (was negative then) and do macrobid course.  - POCT Urinalysis Dipstick - Culture, OB Urine  Preterm labor symptoms and general obstetric precautions including but not limited to vaginal bleeding, contractions, leaking of fluid and fetal movement were reviewed in detail with the patient. Please refer to After Visit Summary for other counseling recommendations.  No Follow-up on file.   [name], Megan Wooldridge, MD

## 2017-11-30 ENCOUNTER — Inpatient Hospital Stay (HOSPITAL_COMMUNITY)
Admission: AD | Admit: 2017-11-30 | Discharge: 2017-11-30 | Disposition: A | Discharge status: Home / Self Care | Payer: Medicaid Other | Source: Ambulatory Visit | Attending: Family Medicine | Admitting: Family Medicine

## 2017-11-30 ENCOUNTER — Encounter (HOSPITAL_COMMUNITY): Payer: Self-pay | Admitting: *Deleted

## 2017-11-30 DIAGNOSIS — Z3A25 25 weeks gestation of pregnancy: Secondary | ICD-10-CM

## 2017-11-30 DIAGNOSIS — Z87891 Personal history of nicotine dependence: Secondary | ICD-10-CM | POA: Diagnosis not present

## 2017-11-30 DIAGNOSIS — O4692 Antepartum hemorrhage, unspecified, second trimester: Secondary | ICD-10-CM | POA: Diagnosis present

## 2017-11-30 DIAGNOSIS — O2342 Unspecified infection of urinary tract in pregnancy, second trimester: Secondary | ICD-10-CM

## 2017-11-30 LAB — WET PREP, GENITAL
SPERM: NONE SEEN
Trich, Wet Prep: NONE SEEN
Yeast Wet Prep HPF POC: NONE SEEN

## 2017-11-30 LAB — URINALYSIS, ROUTINE W REFLEX MICROSCOPIC
BILIRUBIN URINE: NEGATIVE
GLUCOSE, UA: NEGATIVE mg/dL
KETONES UR: 80 mg/dL — AB
NITRITE: NEGATIVE
PH: 5 (ref 5.0–8.0)
Protein, ur: 100 mg/dL — AB
SPECIFIC GRAVITY, URINE: 1.019 (ref 1.005–1.030)

## 2017-11-30 MED ORDER — CEPHALEXIN 500 MG PO CAPS: 500.0000 mg | ORAL_CAPSULE | Freq: Four times a day (QID) | ORAL | 0 refills | Status: DC

## 2017-11-30 MED ORDER — SODIUM CHLORIDE 0.9 % IV BOLUS (SEPSIS)
500.0000 mL | Freq: Once | INTRAVENOUS | Status: AC
  Administered 2017-11-30: 500 mL via INTRAVENOUS

## 2017-11-30 MED ORDER — DEXTROSE 5 % IV SOLN
2.0000 g | Freq: Once | INTRAVENOUS | Status: AC
  Administered 2017-11-30: 2 g via INTRAVENOUS
  Filled 2017-11-30: qty 2

## 2017-11-30 MED ORDER — PHENAZOPYRIDINE HCL 100 MG PO TABS
200.0000 mg | ORAL_TABLET | Freq: Once | ORAL | Status: AC
  Administered 2017-11-30: 200 mg via ORAL
  Filled 2017-11-30: qty 2

## 2017-11-30 MED ORDER — PHENAZOPYRIDINE HCL 200 MG PO TABS: 200.0000 mg | ORAL_TABLET | Freq: Three times a day (TID) | ORAL | 0 refills | Status: DC | PRN

## 2017-11-30 MED ORDER — ONDANSETRON HCL 4 MG/2ML IJ SOLN
4.0000 mg | Freq: Once | INTRAMUSCULAR | Status: AC
  Administered 2017-11-30: 4 mg via INTRAVENOUS
  Filled 2017-11-30: qty 2

## 2017-11-30 NOTE — MAU Provider Note (Signed)
Chief Complaint:  Vaginal Bleeding and Abdominal Pain   First Provider Initiated Contact with Patient 11/30/17 1826     HPI  HPI: [name] is a 26 y.o. G4P3003 at 3125w5dwho presents to maternity admissions reporting vaginal bleeding & abdominal pain. Was seen in office 2 days ago & diagnosed with UTI, started on macrobid which she just started taking this morning. Endorses hematuria, dysuria, urinary frequency, & abdominal pain. Denies fever/chills, n/v, vaginal bleeding or flank pain. Positive fetal movement. Describes abdominal pain as pelvic pressure that she rates 9/10. Has not treated pain. Nothing makes better or worse. States she doesn't think she's having contractions.   Past Medical History: Past Medical History:  Diagnosis Date  . Anxiety   . Asthma   . Bipolar 1 disorder (HCC)     Past obstetric history: OB History  Gravida Para Term Preterm AB Living  4 3 3     3   SAB TAB Ectopic Multiple Live Births          3    # Outcome Date GA Lbr Len/2nd Weight Sex Delivery Anes PTL Lv  4 Current           3 Term 03/13/15 3442w0d  8 lb 1 oz (3.657 kg) M Vag-Spont EPI  LIV  2 Term 07/04/12 242w0d  6 lb 11 oz (3.033 kg) F Vag-Spont EPI  LIV  1 Term 10/26/10 4791w0d  7 lb 7 oz (3.374 kg) F Vag-Spont EPI  LIV      Past Surgical History: Past Surgical History:  Procedure Laterality Date  . WISDOM TOOTH EXTRACTION      Family History: Family History  Problem Relation Age of Onset  . Hypertension Mother   . Hypertension Father     Social History: Social History   Tobacco Use  . Smoking status: Former Smoker    Packs/day: 2.00    Types: Cigarettes    Last attempt to quit: 09/14/2016    Years since quitting: 1.2  . Smokeless tobacco: Never Used  Substance Use Topics  . Alcohol use: No  . Drug use: No    Allergies:  Allergies  Allergen Reactions  . Aspirin Hives  . Ibuprofen Hives  . Sulfa Antibiotics Hives    Meds:  Medications Prior to Admission   Medication Sig Dispense Refill Last Dose  . Butalbital-APAP-Caffeine 50-325-40 MG capsule TAKE 1 TO 2 CAPSULES BY MOUTH EVERY 6 HOURS AS NEEDED FOR HEADACHE 30 capsule 4 Taking  . cephALEXin (KEFLEX) 500 MG capsule Take 1 capsule (500 mg total) by mouth 3 (three) times daily. 21 capsule 0 Taking  . metroNIDAZOLE (FLAGYL) 500 MG tablet Take 1 tablet (500 mg total) by mouth 2 (two) times daily. (Patient not taking: Reported on 11/28/2017) 14 tablet 0 Not Taking  . nitrofurantoin, macrocrystal-monohydrate, (MACROBID) 100 MG capsule Take 1 capsule (100 mg total) by mouth 2 (two) times daily. 14 capsule 1   . ondansetron (ZOFRAN) 4 MG tablet Take 1 tablet (4 mg total) by mouth every 6 (six) hours. 8 tablet 0 Taking  . Pediatric Multiple Vit-C-FA (FLINSTONES GUMMIES OMEGA-3 DHA) CHEW Chew 2 each by mouth daily.    Taking  . promethazine (PHENERGAN) 12.5 MG tablet Take 1 tablet (12.5 mg total) by mouth every 6 (six) hours as needed for nausea or vomiting. (Patient not taking: Reported on 11/28/2017) 30 tablet 0 Not Taking  . terconazole (TERAZOL 7) 0.4 % vaginal cream Place 1 applicator vaginally at bedtime. Use for seven days  45 g 0 Taking    I have reviewed patient's Past Medical Hx, Surgical Hx, Family Hx, Social Hx, medications and allergies.   ROS:  Review of Systems  Constitutional: Negative.   Gastrointestinal: Positive for abdominal pain. Negative for constipation, diarrhea, nausea and vomiting.  Genitourinary: Positive for dysuria, frequency, hematuria and pelvic pain. Negative for flank pain, vaginal bleeding and vaginal discharge.   Other systems negative  Physical Exam   Patient Vitals for the past 24 hrs:  BP Temp Temp src Pulse Resp  11/30/17 1809 - - - (!) 114 -  11/30/17 1805 121/71 98.2 F (36.8 C) Oral (!) 122 19   Constitutional: Well-developed, well-nourished female in no acute distress.  Cardiovascular: normal rate and rhythm Respiratory: normal effort, clear to  auscultation bilaterally GI: Abd soft, non-tender, gravid appropriate for gestational age.   No rebound or guarding. MS: Extremities nontender, no edema, normal ROM Neurologic: Alert and oriented x 4.  GU: Neg CVAT.  PELVIC EXAM: No bleeding. Cervix visually closed   FHT:  Baseline 140 , moderate variability, accelerations present, no decelerations Contractions: none   Labs: Results for orders placed or performed during the hospital encounter of 11/30/17 (from the past 48 hour(s))  Urinalysis, Routine w reflex microscopic     Status: Abnormal   Collection Time: 11/30/17  6:41 PM  Result Value Ref Range   Color, Urine YELLOW YELLOW   APPearance CLOUDY (A) CLEAR   Specific Gravity, Urine 1.019 1.005 - 1.030   pH 5.0 5.0 - 8.0   Glucose, UA NEGATIVE NEGATIVE mg/dL   Hgb urine dipstick LARGE (A) NEGATIVE   Bilirubin Urine NEGATIVE NEGATIVE   Ketones, ur 80 (A) NEGATIVE mg/dL   Protein, ur 161100 (A) NEGATIVE mg/dL   Nitrite NEGATIVE NEGATIVE   Leukocytes, UA LARGE (A) NEGATIVE   RBC / HPF TOO NUMEROUS TO COUNT 0 - 5 RBC/hpf   WBC, UA TOO NUMEROUS TO COUNT 0 - 5 WBC/hpf   Bacteria, UA RARE (A) NONE SEEN   Squamous Epithelial / LPF 0-5 (A) NONE SEEN   Mucus PRESENT   Wet prep, genital     Status: Abnormal   Collection Time: 11/30/17  7:02 PM  Result Value Ref Range   Yeast Wet Prep HPF POC NONE SEEN NONE SEEN   Trich, Wet Prep NONE SEEN NONE SEEN   Clue Cells Wet Prep HPF POC PRESENT (A) NONE SEEN   WBC, Wet Prep HPF POC MANY (A) NONE SEEN    Comment: MODERATE BACTERIA SEEN   Sperm NONE SEEN     AB/Positive/-- (08/31 0000)  Imaging:    MAU Course/MDM: Fetal tracing appropriate for gestation.  Pelvic exam performed -- no bleeding & cervix visually closed No CVAT & pt afebrile. Urine culture pending. Will give IV fluids & ceftriaxone 2 gm IV.  C/w Dr. Adrian BlackwaterStinson. Will d/c home with keflex, pt to stop taking macrobid.  Patient vomited once prior to discharge but is requesting  to go home.   Assessment: 1. Urinary tract infection in mother during second trimester of pregnancy   2. [redacted] weeks gestation of pregnancy     Plan: Discharge home Urine culture pending D/c macrobid Rx keflex Strict return precautions for worsening symptoms or if fever develops   Pt stable at time of discharge.  Judeth HornErin Mikie Misner, FNP 11/30/2017 6:51 PM

## 2017-11-30 NOTE — Discharge Instructions (Signed)

## 2017-11-30 NOTE — MAU Note (Signed)
Started having abd pain? Ctx's and bleeding a couple days ago, went to dr, dx with UTI and started antibiotics. Today was shopping, reports an increase in bleeding and regular ctx's.  Since she has been with EMS, her ctx's stopped, but is still feeling pressure.

## 2017-12-02 LAB — URINE CULTURE, OB REFLEX

## 2017-12-02 LAB — CULTURE, OB URINE

## 2017-12-03 LAB — CULTURE, OB URINE: Culture: 40000 — AB

## 2017-12-12 NOTE — L&D Delivery Note (Signed)
Patient is 27 y.o. [id number] 6953w0d admitted for elective IOL. S/p IOL with Pitocin. SROM at 1220.   Delivery Note At 8:13 PM a viable female was delivered via Vaginal, Spontaneous (Presentation: ROA;).  APGAR: 7, 8; weight 7 lb 6.2 oz (3350 g).   Placenta status: Intact.  Cord: 3V with the following complications: None.  Cord pH: N/A  Anesthesia: Epidural  Episiotomy: None Lacerations: None Est. Blood Loss (mL): 100  Mom to postpartum.  Baby to Couplet care / Skin to Skin.  Megan AdaJazma Chestine Belknap, DO 03/03/2018, 10:49 PM

## 2017-12-13 ENCOUNTER — Encounter: Payer: Self-pay | Admitting: Obstetrics and Gynecology

## 2017-12-14 ENCOUNTER — Ambulatory Visit (INDEPENDENT_AMBULATORY_CARE_PROVIDER_SITE_OTHER): Payer: Medicaid Other | Admitting: Family Medicine

## 2017-12-14 VITALS — BP 99/66 | HR 112 | Wt 182.0 lb

## 2017-12-14 DIAGNOSIS — Z23 Encounter for immunization: Secondary | ICD-10-CM | POA: Diagnosis not present

## 2017-12-14 DIAGNOSIS — Z3480 Encounter for supervision of other normal pregnancy, unspecified trimester: Secondary | ICD-10-CM

## 2017-12-14 DIAGNOSIS — Z3402 Encounter for supervision of normal first pregnancy, second trimester: Secondary | ICD-10-CM | POA: Diagnosis not present

## 2017-12-14 NOTE — Patient Instructions (Signed)
Third Trimester of Pregnancy The third trimester is from week 28 through week 40 (months 7 through 9). The third trimester is a time when the unborn baby (fetus) is growing rapidly. At the end of the ninth month, the fetus is about 20 inches in length and weighs 6-10 pounds. Body changes during your third trimester Your body will continue to go through many changes during pregnancy. The changes vary from woman to woman. During the third trimester:  Your weight will continue to increase. You can expect to gain 25-35 pounds (11-16 kg) by the end of the pregnancy.  You may begin to get stretch marks on your hips, abdomen, and breasts.  You may urinate more often because the fetus is moving lower into your pelvis and pressing on your bladder.  You may develop or continue to have heartburn. This is caused by increased hormones that slow down muscles in the digestive tract.  You may develop or continue to have constipation because increased hormones slow digestion and cause the muscles that push waste through your intestines to relax.  You may develop hemorrhoids. These are swollen veins (varicose veins) in the rectum that can itch or be painful.  You may develop swollen, bulging veins (varicose veins) in your legs.  You may have increased body aches in the pelvis, back, or thighs. This is due to weight gain and increased hormones that are relaxing your joints.  You may have changes in your hair. These can include thickening of your hair, rapid growth, and changes in texture. Some women also have hair loss during or after pregnancy, or hair that feels dry or thin. Your hair will most likely return to normal after your baby is born.  Your breasts will continue to grow and they will continue to become tender. A yellow fluid (colostrum) may leak from your breasts. This is the first milk you are producing for your baby.  Your belly button may stick out.  You may notice more swelling in your hands,  face, or ankles.  You may have increased tingling or numbness in your hands, arms, and legs. The skin on your belly may also feel numb.  You may feel short of breath because of your expanding uterus.  You may have more problems sleeping. This can be caused by the size of your belly, increased need to urinate, and an increase in your body's metabolism.  You may notice the fetus "dropping," or moving lower in your abdomen (lightening).  You may have increased vaginal discharge.  You may notice your joints feel loose and you may have pain around your pelvic bone.  What to expect at prenatal visits You will have prenatal exams every 2 weeks until week 36. Then you will have weekly prenatal exams. During a routine prenatal visit:  You will be weighed to make sure you and the baby are growing normally.  Your blood pressure will be taken.  Your abdomen will be measured to track your baby's growth.  The fetal heartbeat will be listened to.  Any test results from the previous visit will be discussed.  You may have a cervical check near your due date to see if your cervix has softened or thinned (effaced).  You will be tested for Group B streptococcus. This happens between 35 and 37 weeks.  Your health care provider may ask you:  What your birth plan is.  How you are feeling.  If you are feeling the baby move.  If you have had   any abnormal symptoms, such as leaking fluid, bleeding, severe headaches, or abdominal cramping.  If you are using any tobacco products, including cigarettes, chewing tobacco, and electronic cigarettes.  If you have any questions.  Other tests or screenings that may be performed during your third trimester include:  Blood tests that check for low iron levels (anemia).  Fetal testing to check the health, activity level, and growth of the fetus. Testing is done if you have certain medical conditions or if there are problems during the  pregnancy.  Nonstress test (NST). This test checks the health of your baby to make sure there are no signs of problems, such as the baby not getting enough oxygen. During this test, a belt is placed around your belly. The baby is made to move, and its heart rate is monitored during movement.  What is false labor? False labor is a condition in which you feel small, irregular tightenings of the muscles in the womb (contractions) that usually go away with rest, changing position, or drinking water. These are called Braxton Hicks contractions. Contractions may last for hours, days, or even weeks before true labor sets in. If contractions come at regular intervals, become more frequent, increase in intensity, or become painful, you should see your health care provider. What are the signs of labor?  Abdominal cramps.  Regular contractions that start at 10 minutes apart and become stronger and more frequent with time.  Contractions that start on the top of the uterus and spread down to the lower abdomen and back.  Increased pelvic pressure and dull back pain.  A watery or bloody mucus discharge that comes from the vagina.  Leaking of amniotic fluid. This is also known as your "water breaking." It could be a slow trickle or a gush. Let your health care provider know if it has a color or strange odor. If you have any of these signs, call your health care provider right away, even if it is before your due date. Follow these instructions at home: Medicines  Follow your health care provider's instructions regarding medicine use. Specific medicines may be either safe or unsafe to take during pregnancy.  Take a prenatal vitamin that contains at least 600 micrograms (mcg) of folic acid.  If you develop constipation, try taking a stool softener if your health care provider approves. Eating and drinking  Eat a balanced diet that includes fresh fruits and vegetables, whole grains, good sources of protein  such as meat, eggs, or tofu, and low-fat dairy. Your health care provider will help you determine the amount of weight gain that is right for you.  Avoid raw meat and uncooked cheese. These carry germs that can cause birth defects in the baby.  If you have low calcium intake from food, talk to your health care provider about whether you should take a daily calcium supplement.  Eat four or five small meals rather than three large meals a day.  Limit foods that are high in fat and processed sugars, such as fried and sweet foods.  To prevent constipation: ? Drink enough fluid to keep your urine clear or pale yellow. ? Eat foods that are high in fiber, such as fresh fruits and vegetables, whole grains, and beans. Activity  Exercise only as directed by your health care provider. Most women can continue their usual exercise routine during pregnancy. Try to exercise for 30 minutes at least 5 days a week. Stop exercising if you experience uterine contractions.  Avoid heavy   lifting.  Do not exercise in extreme heat or humidity, or at high altitudes.  Wear low-heel, comfortable shoes.  Practice good posture.  You may continue to have sex unless your health care provider tells you otherwise. Relieving pain and discomfort  Take frequent breaks and rest with your legs elevated if you have leg cramps or low back pain.  Take warm sitz baths to soothe any pain or discomfort caused by hemorrhoids. Use hemorrhoid cream if your health care provider approves.  Wear a good support bra to prevent discomfort from breast tenderness.  If you develop varicose veins: ? Wear support pantyhose or compression stockings as told by your healthcare provider. ? Elevate your feet for 15 minutes, 3-4 times a day. Prenatal care  Write down your questions. Take them to your prenatal visits.  Keep all your prenatal visits as told by your health care provider. This is important. Safety  Wear your seat belt at  all times when driving.  Make a list of emergency phone numbers, including numbers for family, friends, the hospital, and police and fire departments. General instructions  Avoid cat litter boxes and soil used by cats. These carry germs that can cause birth defects in the baby. If you have a cat, ask someone to clean the litter box for you.  Do not travel far distances unless it is absolutely necessary and only with the approval of your health care provider.  Do not use hot tubs, steam rooms, or saunas.  Do not drink alcohol.  Do not use any products that contain nicotine or tobacco, such as cigarettes and e-cigarettes. If you need help quitting, ask your health care provider.  Do not use any medicinal herbs or unprescribed drugs. These chemicals affect the formation and growth of the baby.  Do not douche or use tampons or scented sanitary pads.  Do not cross your legs for long periods of time.  To prepare for the arrival of your baby: ? Take prenatal classes to understand, practice, and ask questions about labor and delivery. ? Make a trial run to the hospital. ? Visit the hospital and tour the maternity area. ? Arrange for maternity or paternity leave through employers. ? Arrange for family and friends to take care of pets while you are in the hospital. ? Purchase a rear-facing car seat and make sure you know how to install it in your car. ? Pack your hospital bag. ? Prepare the baby's nursery. Make sure to remove all pillows and stuffed animals from the baby's crib to prevent suffocation.  Visit your dentist if you have not gone during your pregnancy. Use a soft toothbrush to brush your teeth and be gentle when you floss. Contact a health care provider if:  You are unsure if you are in labor or if your water has broken.  You become dizzy.  You have mild pelvic cramps, pelvic pressure, or nagging pain in your abdominal area.  You have lower back pain.  You have persistent  nausea, vomiting, or diarrhea.  You have an unusual or bad smelling vaginal discharge.  You have pain when you urinate. Get help right away if:  Your water breaks before 37 weeks.  You have regular contractions less than 5 minutes apart before 37 weeks.  You have a fever.  You are leaking fluid from your vagina.  You have spotting or bleeding from your vagina.  You have severe abdominal pain or cramping.  You have rapid weight loss or weight gain.    You have shortness of breath with chest pain.  You notice sudden or extreme swelling of your face, hands, ankles, feet, or legs.  Your baby makes fewer than 10 movements in 2 hours.  You have severe headaches that do not go away when you take medicine.  You have vision changes. Summary  The third trimester is from week 28 through week 40, months 7 through 9. The third trimester is a time when the unborn baby (fetus) is growing rapidly.  During the third trimester, your discomfort may increase as you and your baby continue to gain weight. You may have abdominal, leg, and back pain, sleeping problems, and an increased need to urinate.  During the third trimester your breasts will keep growing and they will continue to become tender. A yellow fluid (colostrum) may leak from your breasts. This is the first milk you are producing for your baby.  False labor is a condition in which you feel small, irregular tightenings of the muscles in the womb (contractions) that eventually go away. These are called Braxton Hicks contractions. Contractions may last for hours, days, or even weeks before true labor sets in.  Signs of labor can include: abdominal cramps; regular contractions that start at 10 minutes apart and become stronger and more frequent with time; watery or bloody mucus discharge that comes from the vagina; increased pelvic pressure and dull back pain; and leaking of amniotic fluid. This information is not intended to replace advice  given to you by your health care provider. Make sure you discuss any questions you have with your health care provider. Document Released: 11/22/2001 Document Revised: 05/05/2016 Document Reviewed: 01/29/2013 Elsevier Interactive Patient Education  2017 [employer]  

## 2017-12-14 NOTE — Progress Notes (Signed)
-  tdap today

## 2017-12-14 NOTE — Progress Notes (Signed)
   PRENATAL VISIT NOTE  Subjective:  Megan LeatherwoodJennifer Siebenaler is a 27 y.o. G4P3003 at 6548w5d being seen today for ongoing prenatal care.  She is currently monitored for the following issues for this low-risk pregnancy and has Supervision of other normal pregnancy, antepartum; History of pre-eclampsia in prior pregnancy, currently pregnant; Anxiety disorder affecting pregnancy, antepartum; and Major depressive disorder on their problem list.  Patient reports no complaints.  Contractions: Not present.  .  Movement: Present. Denies leaking of fluid.   The following portions of the patient's history were reviewed and updated as appropriate: allergies, current medications, past family history, past medical history, past social history, past surgical history and problem list. Problem list updated.  Objective:   Vitals:   12/14/17 0930  BP: 99/66  Pulse: (!) 112  Weight: 182 lb (82.6 kg)    Fetal Status: Fetal Heart Rate (bpm): 151 Fundal Height: 27 cm Movement: Present     General:  Alert, oriented and cooperative. Patient is in no acute distress.  Skin: Skin is warm and dry. No rash noted.   Cardiovascular: Normal heart rate noted  Respiratory: Normal respiratory effort, no problems with respiration noted  Abdomen: Soft, gravid, appropriate for gestational age.  Pain/Pressure: Absent     Pelvic: Cervical exam deferred        Extremities: Normal range of motion.  Edema: None  Mental Status:  Normal mood and affect. Normal behavior. Normal judgment and thought content.   Assessment and Plan:  Pregnancy: G4P3003 at 3848w5d  1. Supervision of other normal pregnancy, antepartum 28 wk labs and TDaP today - HIV antibody - RPR - CBC - Glucose Tolerance, 2 Hours w/1 Hour  Preterm labor symptoms and general obstetric precautions including but not limited to vaginal bleeding, contractions, leaking of fluid and fetal movement were reviewed in detail with the patient. Please refer to After Visit Summary  for other counseling recommendations.  Return in 2 weeks (on 12/28/2017).   Reva Boresanya S Rhys Lichty, MD

## 2017-12-15 LAB — CBC
Hematocrit: 34.9 % (ref 34.0–46.6)
Hemoglobin: 11.8 g/dL (ref 11.1–15.9)
MCH: 29.2 pg (ref 26.6–33.0)
MCHC: 33.8 g/dL (ref 31.5–35.7)
MCV: 86 fL (ref 79–97)
PLATELETS: 248 10*3/uL (ref 150–379)
RBC: 4.04 x10E6/uL (ref 3.77–5.28)
RDW: 13.6 % (ref 12.3–15.4)
WBC: 11.8 10*3/uL — AB (ref 3.4–10.8)

## 2017-12-15 LAB — HIV ANTIBODY (ROUTINE TESTING W REFLEX): HIV SCREEN 4TH GENERATION: NONREACTIVE

## 2017-12-15 LAB — GLUCOSE TOLERANCE, 2 HOURS W/ 1HR
GLUCOSE, 2 HOUR: 88 mg/dL (ref 65–152)
GLUCOSE, FASTING: 76 mg/dL (ref 65–91)
Glucose, 1 hour: 79 mg/dL (ref 65–179)

## 2017-12-15 LAB — RPR: RPR: NONREACTIVE

## 2017-12-28 ENCOUNTER — Telehealth: Payer: Self-pay

## 2017-12-28 NOTE — Telephone Encounter (Signed)
Spoke with patient regarding cold symptoms. I have advised her to try tylenol for pain and she can try robitussin(plain) for cough. I have advised patient if she becomes dehydrate and stops drinking fluids she will need to go to MAU to be check out.Patient voice understanding at this time.

## 2017-12-28 NOTE — Telephone Encounter (Signed)
-----   Message from Lindell SparHeather L Bacon, [place] sent at 12/28/2017  9:32 AM EST ----- Regarding: patient requesting a call back Patient would like to have someone call her back, was seen at urgent care for bronchitis and wasn't giving any treatment, told to call us if got worse for treatment.

## 2017-12-29 ENCOUNTER — Encounter: Payer: Self-pay | Admitting: Obstetrics & Gynecology

## 2017-12-29 ENCOUNTER — Ambulatory Visit (INDEPENDENT_AMBULATORY_CARE_PROVIDER_SITE_OTHER): Payer: Medicaid Other | Admitting: Obstetrics & Gynecology

## 2017-12-29 DIAGNOSIS — Z3480 Encounter for supervision of other normal pregnancy, unspecified trimester: Secondary | ICD-10-CM

## 2017-12-29 DIAGNOSIS — O2340 Unspecified infection of urinary tract in pregnancy, unspecified trimester: Secondary | ICD-10-CM | POA: Insufficient documentation

## 2017-12-29 DIAGNOSIS — O2342 Unspecified infection of urinary tract in pregnancy, second trimester: Secondary | ICD-10-CM

## 2017-12-29 DIAGNOSIS — Z3482 Encounter for supervision of other normal pregnancy, second trimester: Secondary | ICD-10-CM

## 2017-12-29 NOTE — Progress Notes (Signed)
   PRENATAL VISIT NOTE  Subjective:  [name] is a 27 y.o. G4P3003 at 6457w6d being seen today for ongoing prenatal care.  She is currently monitored for the following issues for this low-risk pregnancy and has Supervision of other normal pregnancy, antepartum; History of pre-eclampsia in prior pregnancy, currently pregnant; Anxiety disorder affecting pregnancy, antepartum; Major depressive disorder; and UTI (urinary tract infection) during pregnancy on their problem list.  Patient reports no complaints.  Contractions: Not present. Vag. Bleeding: None.  Movement: Present. Denies leaking of fluid.   The following portions of the patient's history were reviewed and updated as appropriate: allergies, current medications, past family history, past medical history, past social history, past surgical history and problem list. Problem list updated.  Objective:   Vitals:   12/29/17 1059  BP: 120/76  Pulse: (!) 108  Weight: 182 lb (82.6 kg)    Fetal Status: Fetal Heart Rate (bpm): 150   Movement: Present     General:  Alert, oriented and cooperative. Patient is in no acute distress.  Skin: Skin is warm and dry. No rash noted.   Cardiovascular: Normal heart rate noted  Respiratory: Normal respiratory effort, no problems with respiration noted  Abdomen: Soft, gravid, appropriate for gestational age.  Pain/Pressure: Absent     Pelvic: Cervical exam deferred        Extremities: Normal range of motion.  Edema: None  Mental Status:  Normal mood and affect. Normal behavior. Normal judgment and thought content.   Assessment and Plan:  Pregnancy: G4P3003 at 2557w6d  1. Urinary tract infection in mother during second trimester of pregnancy TOC culture done today. No symptoms. - Culture, OB Urine  2. Supervision of other normal pregnancy, antepartum Preterm labor symptoms and general obstetric precautions including but not limited to vaginal bleeding, contractions, leaking of fluid and fetal  movement were reviewed in detail with the patient. Please refer to After Visit Summary for other counseling recommendations.  Return in about 2 weeks (around 01/12/2018) for OB Visit.   Jaynie CollinsUgonna Zeanna Sunde, MD

## 2017-12-29 NOTE — Patient Instructions (Signed)
Return to clinic for any scheduled appointments or obstetric concerns, or go to MAU for evaluation  

## 2018-01-01 ENCOUNTER — Encounter: Payer: Self-pay | Admitting: *Deleted

## 2018-01-11 ENCOUNTER — Ambulatory Visit (INDEPENDENT_AMBULATORY_CARE_PROVIDER_SITE_OTHER): Payer: Medicaid Other | Admitting: Obstetrics and Gynecology

## 2018-01-11 ENCOUNTER — Encounter: Payer: Self-pay | Admitting: Obstetrics and Gynecology

## 2018-01-11 VITALS — BP 114/71 | HR 116 | Wt 182.0 lb

## 2018-01-11 DIAGNOSIS — Z3480 Encounter for supervision of other normal pregnancy, unspecified trimester: Secondary | ICD-10-CM

## 2018-01-11 DIAGNOSIS — O2340 Unspecified infection of urinary tract in pregnancy, unspecified trimester: Secondary | ICD-10-CM

## 2018-01-11 NOTE — Addendum Note (Signed)
Addended by: Cheree DittoGRAHAM, DEMETRICE A on: 01/11/2018 01:14 PM   Modules accepted: Orders

## 2018-01-11 NOTE — Progress Notes (Signed)
Prenatal Visit Note Date: 01/11/2018 Clinic: [facility]  Subjective:  Natalia LeatherwoodJennifer Double is a 27 y.o. [phone] at 7270w5d being seen today for ongoing prenatal care.  She is currently monitored for the following issues for this low-risk pregnancy and has Supervision of other normal pregnancy, antepartum; History of pre-eclampsia in prior pregnancy, currently pregnant; Anxiety disorder affecting pregnancy, antepartum; Major depressive disorder; and UTI (urinary tract infection) during pregnancy on their problem list.  Patient reports no complaints.   Contractions: Irregular. Vag. Bleeding: None.  Movement: Present. Denies leaking of fluid.   The following portions of the patient's history were reviewed and updated as appropriate: allergies, current medications, past family history, past medical history, past social history, past surgical history and problem list. Problem list updated.  Objective:   Vitals:   01/11/18 0958  BP: 114/71  Pulse: (!) 116  Weight: 182 lb (82.6 kg)    Fetal Status: Fetal Heart Rate (bpm): 150 Fundal Height: 31 cm Movement: Present     General:  Alert, oriented and cooperative. Patient is in no acute distress.  Skin: Skin is warm and dry. No rash noted.   Cardiovascular: Normal heart rate noted  Respiratory: Normal respiratory effort, no problems with respiration noted  Abdomen: Soft, gravid, appropriate for gestational age. Pain/Pressure: Present     Pelvic:  Cervical exam deferred        Extremities: Normal range of motion.  Edema: None  Mental Status: Normal mood and affect. Normal behavior. Normal judgment and thought content.   Urinalysis:      Assessment and Plan:  Pregnancy: G4P3003 at 5870w5d  1. Urinary tract infection in mother during pregnancy, antepartum toc today - Culture, OB Urine  2. Supervision of other normal pregnancy, antepartum Pt wondering about elective iol at 39-40wks since she has two other kids with her and  family lives a few hours away. I told her that if this is still an issue to ask us around 35-36wks and we can ask the hospital to make sure this is okay and can go over with her pros and cons of elective iol BTL papers already signed 1/21  Preterm labor symptoms and general obstetric precautions including but not limited to vaginal bleeding, contractions, leaking of fluid and fetal movement were reviewed in detail with the patient. Please refer to After Visit Summary for other counseling recommendations.  Return in about 2 weeks (around 01/25/2018) for rob.   [place] BingPickens, Hila Bolding, MD

## 2018-01-16 ENCOUNTER — Encounter: Payer: Self-pay | Admitting: Obstetrics and Gynecology

## 2018-01-22 ENCOUNTER — Encounter (HOSPITAL_COMMUNITY): Payer: Self-pay | Admitting: *Deleted

## 2018-01-22 ENCOUNTER — Inpatient Hospital Stay (HOSPITAL_COMMUNITY)
Admission: AD | Admit: 2018-01-22 | Discharge: 2018-01-22 | Disposition: A | Discharge status: Home / Self Care | Payer: Medicaid Other | Source: Ambulatory Visit | Attending: Obstetrics and Gynecology | Admitting: Obstetrics and Gynecology

## 2018-01-22 ENCOUNTER — Telehealth: Payer: Self-pay | Admitting: *Deleted

## 2018-01-22 DIAGNOSIS — O9989 Other specified diseases and conditions complicating pregnancy, childbirth and the puerperium: Secondary | ICD-10-CM | POA: Diagnosis not present

## 2018-01-22 DIAGNOSIS — Z3A33 33 weeks gestation of pregnancy: Secondary | ICD-10-CM | POA: Diagnosis not present

## 2018-01-22 DIAGNOSIS — O26893 Other specified pregnancy related conditions, third trimester: Secondary | ICD-10-CM | POA: Insufficient documentation

## 2018-01-22 DIAGNOSIS — R109 Unspecified abdominal pain: Secondary | ICD-10-CM | POA: Diagnosis present

## 2018-01-22 DIAGNOSIS — Z9889 Other specified postprocedural states: Secondary | ICD-10-CM | POA: Diagnosis not present

## 2018-01-22 DIAGNOSIS — Z87891 Personal history of nicotine dependence: Secondary | ICD-10-CM | POA: Diagnosis not present

## 2018-01-22 DIAGNOSIS — Z882 Allergy status to sulfonamides status: Secondary | ICD-10-CM | POA: Insufficient documentation

## 2018-01-22 DIAGNOSIS — Z886 Allergy status to analgesic agent status: Secondary | ICD-10-CM | POA: Insufficient documentation

## 2018-01-22 DIAGNOSIS — N898 Other specified noninflammatory disorders of vagina: Secondary | ICD-10-CM

## 2018-01-22 DIAGNOSIS — F319 Bipolar disorder, unspecified: Secondary | ICD-10-CM | POA: Insufficient documentation

## 2018-01-22 DIAGNOSIS — O99340 Other mental disorders complicating pregnancy, unspecified trimester: Secondary | ICD-10-CM

## 2018-01-22 DIAGNOSIS — Z8249 Family history of ischemic heart disease and other diseases of the circulatory system: Secondary | ICD-10-CM | POA: Diagnosis not present

## 2018-01-22 DIAGNOSIS — F419 Anxiety disorder, unspecified: Secondary | ICD-10-CM | POA: Insufficient documentation

## 2018-01-22 DIAGNOSIS — O99343 Other mental disorders complicating pregnancy, third trimester: Secondary | ICD-10-CM | POA: Diagnosis not present

## 2018-01-22 DIAGNOSIS — Z8759 Personal history of other complications of pregnancy, childbirth and the puerperium: Secondary | ICD-10-CM | POA: Diagnosis not present

## 2018-01-22 DIAGNOSIS — R519 Headache, unspecified: Secondary | ICD-10-CM

## 2018-01-22 DIAGNOSIS — O26899 Other specified pregnancy related conditions, unspecified trimester: Secondary | ICD-10-CM

## 2018-01-22 DIAGNOSIS — O26892 Other specified pregnancy related conditions, second trimester: Secondary | ICD-10-CM

## 2018-01-22 DIAGNOSIS — R51 Headache: Secondary | ICD-10-CM | POA: Diagnosis not present

## 2018-01-22 DIAGNOSIS — O2343 Unspecified infection of urinary tract in pregnancy, third trimester: Secondary | ICD-10-CM | POA: Diagnosis not present

## 2018-01-22 HISTORY — DX: Gestational (pregnancy-induced) hypertension without significant proteinuria, unspecified trimester: O13.9

## 2018-01-22 LAB — URINALYSIS, ROUTINE W REFLEX MICROSCOPIC
Bilirubin Urine: NEGATIVE
Glucose, UA: NEGATIVE mg/dL
HGB URINE DIPSTICK: NEGATIVE
Ketones, ur: 5 mg/dL — AB
NITRITE: NEGATIVE
PROTEIN: 30 mg/dL — AB
Specific Gravity, Urine: 1.021 (ref 1.005–1.030)
pH: 7 (ref 5.0–8.0)

## 2018-01-22 LAB — WET PREP, GENITAL
Clue Cells Wet Prep HPF POC: NONE SEEN
Sperm: NONE SEEN
Trich, Wet Prep: NONE SEEN
YEAST WET PREP: NONE SEEN

## 2018-01-22 MED ORDER — BUTALBITAL-APAP-CAFFEINE 50-325-40 MG PO CAPS: ORAL_CAPSULE | ORAL | 2 refills | Status: DC

## 2018-01-22 MED ORDER — CEPHALEXIN 500 MG PO CAPS: 500.0000 mg | ORAL_CAPSULE | Freq: Four times a day (QID) | ORAL | 2 refills | Status: DC

## 2018-01-22 NOTE — MAU Note (Signed)
Pt states she called her doctor's office this am and was told to come here. States she has been having headaches throughout the pregnancy but they had stopped for a while but started again a week ago, some blurred vision off/on, lower abd pain, vaginal discharge.

## 2018-01-22 NOTE — Telephone Encounter (Signed)
Pt called in stating she woke up with severe back pain, brown vaginal discharge and small amount of bleeding. Advised pt to be seen at MAU as no provider in office right now. Pt states she will go to MAU

## 2018-01-22 NOTE — MAU Provider Note (Signed)
Chief Complaint:  Abdominal Pain; Vaginal Discharge; and Headache   First Provider Initiated Contact with Patient 01/22/18 1017      HPI: [name] is a 27 y.o. G4P3003 at [redacted]w[redacted]d who presents to maternity admissions reporting headaches x 1 week. She had h/a in early pregnancy treated with Fioricet but has not needed any medication in weeks.  The h/a is frontal, intermittent, dull. She has not tried any medications because she is out of Fioricet.  She also reports intermittent blurred vision, lower abdominal cramping, and vaginal discharge.  She has not tried any treatments. There are no other associated symptoms.  She has pmhx of preeclampsia in previous pregnancy but no HTN this pregnancy. She reports good fetal movement, denies LOF, vaginal bleeding, vaginal itching/burning, urinary symptoms, dizziness, n/v, or fever/chills.      HPI  Past Medical History: Past Medical History:  Diagnosis Date  . Anxiety   . Asthma   . Bipolar 1 disorder (HCC)   . Pregnancy induced hypertension     Past obstetric history: OB History  Gravida Para Term Preterm AB Living  4 3 3     3   SAB TAB Ectopic Multiple Live Births          3    # Outcome Date GA Lbr Len/2nd Weight Sex Delivery Anes PTL Lv  4 Current           3 Term 03/13/15 [redacted]w[redacted]d  8 lb 1 oz (3.657 kg) M Vag-Spont EPI  LIV  2 Term 07/04/12 [redacted]w[redacted]d  6 lb 11 oz (3.033 kg) F Vag-Spont EPI  LIV  1 Term 10/26/10 [redacted]w[redacted]d  7 lb 7 oz (3.374 kg) F Vag-Spont EPI  LIV      Past Surgical History: Past Surgical History:  Procedure Laterality Date  . WISDOM TOOTH EXTRACTION      Family History: Family History  Problem Relation Age of Onset  . Hypertension Mother   . Hypertension Father     Social History: Social History   Tobacco Use  . Smoking status: Former Smoker    Packs/day: 2.00    Types: Cigarettes    Last attempt to quit: 09/14/2016    Years since quitting: 1.3  . Smokeless tobacco: Never Used  Substance Use Topics  .  Alcohol use: No  . Drug use: No    Allergies:  Allergies  Allergen Reactions  . Aspirin Hives  . Ibuprofen Hives  . Sulfa Antibiotics Hives    Meds:  No medications prior to admission.    ROS:  Review of Systems  Constitutional: Negative for chills, fatigue and fever.  Eyes: Positive for visual disturbance.  Respiratory: Negative for shortness of breath.   Cardiovascular: Negative for chest pain.  Gastrointestinal: Negative for abdominal pain, nausea and vomiting.  Genitourinary: Positive for pelvic pain and vaginal discharge. Negative for difficulty urinating, dysuria, flank pain, vaginal bleeding and vaginal pain.  Neurological: Positive for headaches. Negative for dizziness.  Psychiatric/Behavioral: Negative.      I have reviewed patient's Past Medical Hx, Surgical Hx, Family Hx, Social Hx, medications and allergies.   Physical Exam   Patient Vitals for the past 24 hrs:  BP Temp Temp src Pulse Resp SpO2 Height Weight  01/22/18 1152 118/70 - - 98 - - - -  01/22/18 0944 122/71 98 F (36.7 C) Oral (!) 121 15 99 % 5\' 3"  (1.6 m) 186 lb (84.4 kg)   Constitutional: Well-developed, well-nourished female in no acute distress.  Cardiovascular: normal  rate Respiratory: normal effort GI: Abd soft, non-tender, gravid appropriate for gestational age.  MS: Extremities nontender, no edema, normal ROM Neurologic: Alert and oriented x 4.  GU: Neg CVAT.  PELVIC EXAM: Cervix pink, visually closed, without lesion, scant white creamy discharge, vaginal walls and external genitalia normal Bimanual exam: Cervix 0/long/high, firm, anterior, neg CMT, uterus nontender, nonenlarged, adnexa without tenderness, enlargement, or mass  Dilation: Closed Effacement (%): Thick Cervical Position: Posterior Exam by:: Sharen CounterLisa Leftwich-Kirby, CNM  FHT:  Baseline 145, moderate variability, accelerations present, no decelerations Contractions: None on toco or to palpation   Labs: Results for orders  placed or performed during the hospital encounter of 01/22/18 (from the past 24 hour(s))  Urinalysis, Routine w reflex microscopic     Status: Abnormal   Collection Time: 01/22/18  9:48 AM  Result Value Ref Range   Color, Urine AMBER (A) YELLOW   APPearance CLOUDY (A) CLEAR   Specific Gravity, Urine 1.021 1.005 - 1.030   pH 7.0 5.0 - 8.0   Glucose, UA NEGATIVE NEGATIVE mg/dL   Hgb urine dipstick NEGATIVE NEGATIVE   Bilirubin Urine NEGATIVE NEGATIVE   Ketones, ur 5 (A) NEGATIVE mg/dL   Protein, ur 30 (A) NEGATIVE mg/dL   Nitrite NEGATIVE NEGATIVE   Leukocytes, UA MODERATE (A) NEGATIVE   RBC / HPF 0-5 0 - 5 RBC/hpf   WBC, UA TOO NUMEROUS TO COUNT 0 - 5 WBC/hpf   Bacteria, UA RARE (A) NONE SEEN   Squamous Epithelial / LPF TOO NUMEROUS TO COUNT (A) NONE SEEN   Mucus PRESENT   Wet prep, genital     Status: Abnormal   Collection Time: 01/22/18 10:22 AM  Result Value Ref Range   Yeast Wet Prep HPF POC NONE SEEN NONE SEEN   Trich, Wet Prep NONE SEEN NONE SEEN   Clue Cells Wet Prep HPF POC NONE SEEN NONE SEEN   WBC, Wet Prep HPF POC MANY (A) NONE SEEN   Sperm NONE SEEN    AB/Positive/-- (08/31 0000)  Imaging:  No results found.  MAU Course/MDM: I have ordered labs and reviewed results.  NST reviewed and reactive No evidence of preterm labor BP wnl so no evidence of preeclampsia Will treat h/a with renewed Rx for Fioricet Possible UTI with UA, will treat with Keflex QID x 7 days Urine sent for culture F/U in office as scheduled Return to MAU for emergencies Pt discharge with strict preterm labor precautions.  Today's evaluation included a work-up for preterm labor which can be life-threatening for both mom and baby.  Assessment: 1. UTI (urinary tract infection) during pregnancy, third trimester   2. Anxiety disorder affecting pregnancy, antepartum   3. Abdominal pain complicating pregnancy   4. Vaginal discharge during pregnancy in third trimester   5. Headache in  pregnancy, antepartum, second trimester   6. Headache in pregnancy, antepartum, third trimester     Plan: Discharge home Labor precautions and fetal kick counts Follow-up Information    [facility] [place].   Specialty:  Obstetrics and Gynecology Why:  As scheduled on Friday, return to MAU as needed for emergencies Contact information: [address] [place] [id number] [phone]         Allergies as of 01/22/2018      Reactions   Aspirin Hives   Ibuprofen Hives   Sulfa Antibiotics Hives      Medication List    STOP taking these medications   ondansetron  4 MG tablet Commonly known as:  ZOFRAN   phenazopyridine 200 MG tablet Commonly known as:  PYRIDIUM   terconazole 0.4 % vaginal cream Commonly known as:  TERAZOL 7     TAKE these medications   Butalbital-APAP-Caffeine 50-325-40 MG capsule TAKE 1 TO 2 CAPSULES BY MOUTH EVERY 6 HOURS AS NEEDED FOR HEADACHE   cephALEXin 500 MG capsule Commonly known as:  KEFLEX Take 1 capsule (500 mg total) by mouth 4 (four) times daily.       Sharen Counter Certified Nurse-Midwife 01/22/2018 10:25 PM

## 2018-01-23 LAB — GC/CHLAMYDIA PROBE AMP (~~LOC~~) NOT AT ARMC
Chlamydia: NEGATIVE
Neisseria Gonorrhea: NEGATIVE

## 2018-01-24 LAB — CULTURE, OB URINE: Culture: >=100000 — AB

## 2018-01-26 ENCOUNTER — Ambulatory Visit (INDEPENDENT_AMBULATORY_CARE_PROVIDER_SITE_OTHER): Payer: Medicaid Other | Admitting: Obstetrics & Gynecology

## 2018-01-26 ENCOUNTER — Encounter: Payer: Self-pay | Admitting: Obstetrics & Gynecology

## 2018-01-26 VITALS — BP 108/71 | HR 100 | Wt 183.0 lb

## 2018-01-26 DIAGNOSIS — O2343 Unspecified infection of urinary tract in pregnancy, third trimester: Secondary | ICD-10-CM

## 2018-01-26 DIAGNOSIS — Z3480 Encounter for supervision of other normal pregnancy, unspecified trimester: Secondary | ICD-10-CM

## 2018-01-26 NOTE — Progress Notes (Signed)
   PRENATAL VISIT NOTE  Subjective:  Megan LeatherwoodJennifer Dancel is a 27 y.o. G4P3003 at 10759w6d being seen today for ongoing prenatal care.  She is currently monitored for the following issues for this low-risk pregnancy and has Supervision of other normal pregnancy, antepartum; History of pre-eclampsia in prior pregnancy, currently pregnant; Anxiety disorder affecting pregnancy, antepartum; Major depressive disorder; and UTI (urinary tract infection) during pregnancy on their problem list.  Patient reports no complaints.  Contractions: Irregular. Vag. Bleeding: None.  Movement: Present. Denies leaking of fluid.   The following portions of the patient's history were reviewed and updated as appropriate: allergies, current medications, past family history, past medical history, past social history, past surgical history and problem list. Problem list updated.  Objective:   Vitals:   01/26/18 1045  BP: 108/71  Pulse: 100  Weight: 183 lb (83 kg)    Fetal Status: Fetal Heart Rate (bpm): 150 Fundal Height: 35 cm Movement: Present     General:  Alert, oriented and cooperative. Patient is in no acute distress.  Skin: Skin is warm and dry. No rash noted.   Cardiovascular: Normal heart rate noted  Respiratory: Normal respiratory effort, no problems with respiration noted  Abdomen: Soft, gravid, appropriate for gestational age.  Pain/Pressure: Present     Pelvic: Cervical exam deferred        Extremities: Normal range of motion.  Edema: None  Mental Status:  Normal mood and affect. Normal behavior. Normal judgment and thought content.   Assessment and Plan:  Pregnancy: G4P3003 at 3959w6d  1. Urinary tract infection in mother during third trimester of pregnancy Diagnosed with E.coli UTI on 01/22/18, still on regimen.  2. Supervision of other normal pregnancy, antepartum Preterm labor symptoms and general obstetric precautions including but not limited to vaginal bleeding, contractions, leaking of fluid and  fetal movement were reviewed in detail with the patient. Please refer to After Visit Summary for other counseling recommendations.  Return in about 2 months (around 03/26/2018) for OB Visit, Pelvic cultures.   Jaynie CollinsUgonna Breunna Nordmann, MD

## 2018-01-26 NOTE — Patient Instructions (Signed)
Return to clinic for any scheduled appointments or obstetric concerns, or go to MAU for evaluation  

## 2018-02-06 ENCOUNTER — Encounter (HOSPITAL_COMMUNITY): Payer: Self-pay

## 2018-02-06 ENCOUNTER — Inpatient Hospital Stay (HOSPITAL_COMMUNITY)
Admission: AD | Admit: 2018-02-06 | Discharge: 2018-02-06 | Discharge status: Against Medical Advice | Payer: Medicaid Other | Source: Ambulatory Visit | Attending: Family Medicine | Admitting: Family Medicine

## 2018-02-06 ENCOUNTER — Inpatient Hospital Stay (HOSPITAL_COMMUNITY): Payer: Medicaid Other

## 2018-02-06 DIAGNOSIS — Z8249 Family history of ischemic heart disease and other diseases of the circulatory system: Secondary | ICD-10-CM | POA: Insufficient documentation

## 2018-02-06 DIAGNOSIS — Z5321 Procedure and treatment not carried out due to patient leaving prior to being seen by health care provider: Secondary | ICD-10-CM | POA: Insufficient documentation

## 2018-02-06 DIAGNOSIS — Z886 Allergy status to analgesic agent status: Secondary | ICD-10-CM | POA: Insufficient documentation

## 2018-02-06 DIAGNOSIS — Z79899 Other long term (current) drug therapy: Secondary | ICD-10-CM | POA: Insufficient documentation

## 2018-02-06 DIAGNOSIS — O99343 Other mental disorders complicating pregnancy, third trimester: Secondary | ICD-10-CM | POA: Insufficient documentation

## 2018-02-06 DIAGNOSIS — O2343 Unspecified infection of urinary tract in pregnancy, third trimester: Secondary | ICD-10-CM | POA: Insufficient documentation

## 2018-02-06 DIAGNOSIS — Z3A35 35 weeks gestation of pregnancy: Secondary | ICD-10-CM | POA: Diagnosis not present

## 2018-02-06 DIAGNOSIS — O368130 Decreased fetal movements, third trimester, not applicable or unspecified: Secondary | ICD-10-CM

## 2018-02-06 DIAGNOSIS — Z87891 Personal history of nicotine dependence: Secondary | ICD-10-CM | POA: Insufficient documentation

## 2018-02-06 DIAGNOSIS — Z882 Allergy status to sulfonamides status: Secondary | ICD-10-CM | POA: Insufficient documentation

## 2018-02-06 DIAGNOSIS — F419 Anxiety disorder, unspecified: Secondary | ICD-10-CM | POA: Insufficient documentation

## 2018-02-06 DIAGNOSIS — F319 Bipolar disorder, unspecified: Secondary | ICD-10-CM | POA: Insufficient documentation

## 2018-02-06 DIAGNOSIS — J45909 Unspecified asthma, uncomplicated: Secondary | ICD-10-CM | POA: Diagnosis not present

## 2018-02-06 DIAGNOSIS — Z0371 Encounter for suspected problem with amniotic cavity and membrane ruled out: Secondary | ICD-10-CM

## 2018-02-06 DIAGNOSIS — Z5329 Procedure and treatment not carried out because of patient's decision for other reasons: Secondary | ICD-10-CM

## 2018-02-06 DIAGNOSIS — O99513 Diseases of the respiratory system complicating pregnancy, third trimester: Secondary | ICD-10-CM | POA: Diagnosis not present

## 2018-02-06 DIAGNOSIS — Z5320 Procedure and treatment not carried out because of patient's decision for unspecified reasons: Secondary | ICD-10-CM

## 2018-02-06 LAB — URINALYSIS, ROUTINE W REFLEX MICROSCOPIC
Bilirubin Urine: NEGATIVE
GLUCOSE, UA: NEGATIVE mg/dL
Hgb urine dipstick: NEGATIVE
Ketones, ur: 5 mg/dL — AB
NITRITE: POSITIVE — AB
PH: 8 (ref 5.0–8.0)
PROTEIN: 30 mg/dL — AB
Specific Gravity, Urine: 1.018 (ref 1.005–1.030)

## 2018-02-06 LAB — WET PREP, GENITAL
CLUE CELLS WET PREP: NONE SEEN
Sperm: NONE SEEN
TRICH WET PREP: NONE SEEN
Yeast Wet Prep HPF POC: NONE SEEN

## 2018-02-06 LAB — POCT FERN TEST: POCT Fern Test: NEGATIVE

## 2018-02-06 MED ORDER — SODIUM CHLORIDE 0.9 % IV SOLN: INTRAVENOUS | Status: DC

## 2018-02-06 MED ORDER — SODIUM CHLORIDE 0.9 % IV SOLN
2.0000 g | Freq: Once | INTRAVENOUS | Status: DC
  Filled 2018-02-06: qty 20

## 2018-02-06 NOTE — MAU Note (Signed)
Pt requesting to leave before being treated with IV abx, states she needs to pick up from children from school. RN explains purpose of IV abx and importance of receiving them and patient verbalizes she needs to leave. Pt signs AMA form and leaves the unit.

## 2018-02-06 NOTE — MAU Note (Signed)
Urine in lab 

## 2018-02-06 NOTE — MAU Note (Signed)
Reports leaking for about a week, notices white thick discharge in underwear with some green specks. Unsure if amniotic fluid. When she coughs she feels fluid coming out.   Increased pelvic pressure  Some mucus bloody spotting on Thursday. Some spotting on toilet paper for 2 weeks.  Decreased fetal movement, has felt some but not as active as normal

## 2018-02-06 NOTE — MAU Provider Note (Signed)
History     CSN: [id number]  Arrival date and time: 02/06/18 1121   First Provider Initiated Contact with Patient 02/06/18 1214      Chief Complaint  Patient presents with  . Decreased Fetal Movement  . Vaginal Discharge  . Rupture of Membranes   HPI [name] is a [age] y.o. G4P3003 at 3147w3d who presents with vaginal discharge, pelvic pressure, and decreased fetal movement. Reports decreased fetal movement since this morning; states she hears the movement on the monitor but hasn't actually felt movement today. Reports pelvic pressure and intermittent contractions. Rates pain 7/10. Denies vaginal bleeding. Has noticed increase in vaginal discharge for the last week. Discharge is mucoid & has green specks in it. States she googled her symptoms & thinks she is leaking amniotic fluid with meconium.  Has been treated for UTI 3 times since December. Most recently completed course of keflex last week. Denies dysuria, hematuria, fever/chills, or flank pain.   OB History    Gravida Para Term Preterm AB Living   4 3 3     3    SAB TAB Ectopic Multiple Live Births           3      Past Medical History:  Diagnosis Date  . Anxiety   . Asthma   . Bipolar 1 disorder (HCC)   . Pregnancy induced hypertension     Past Surgical History:  Procedure Laterality Date  . WISDOM TOOTH EXTRACTION      Family History  Problem Relation Age of Onset  . Hypertension Mother   . Hypertension Father     Social History   Tobacco Use  . Smoking status: Former Smoker    Packs/day: 2.00    Types: Cigarettes    Last attempt to quit: 09/14/2016    Years since quitting: 1.3  . Smokeless tobacco: Never Used  Substance Use Topics  . Alcohol use: No  . Drug use: No    Allergies:  Allergies  Allergen Reactions  . Aspirin Hives  . Ibuprofen Hives  . Sulfa Antibiotics Hives    Medications Prior to Admission  Medication Sig Dispense Refill Last Dose  . Butalbital-APAP-Caffeine 50-325-40 MG  capsule TAKE 1 TO 2 CAPSULES BY MOUTH EVERY 6 HOURS AS NEEDED FOR HEADACHE 30 capsule 2 Taking  . cephALEXin (KEFLEX) 500 MG capsule Take 1 capsule (500 mg total) by mouth 4 (four) times daily. (Patient not taking: Reported on 01/26/2018) 28 capsule 2 Not Taking    Review of Systems  Constitutional: Negative.   Gastrointestinal: Positive for abdominal pain. Negative for diarrhea, nausea and vomiting.  Genitourinary: Positive for vaginal discharge. Negative for dysuria, flank pain, hematuria and vaginal bleeding.   Physical Exam   Blood pressure (!) 115/59, pulse (!) 116, temperature 98 F (36.7 C), temperature source Oral, resp. rate 18, height 5\' 4"  (1.626 m), weight 187 lb 6.4 oz (85 kg), last menstrual period 06/12/2017.  Physical Exam  Nursing note and vitals reviewed. Constitutional: She is oriented to person, place, and time. She appears well-developed and well-nourished. No distress.  HENT:  Head: Normocephalic and atraumatic.  Eyes: Conjunctivae are normal. Right eye exhibits no discharge. Left eye exhibits no discharge. No scleral icterus.  Neck: Normal range of motion.  Cardiovascular: Normal rate, regular rhythm and normal heart sounds.  No murmur heard. Respiratory: Effort normal and breath sounds normal. No respiratory distress. She has no wheezes.  GI: Soft.  Genitourinary: Vaginal discharge (small amount of white mucoid dishcarge.  No pooling of fluid. ) found.  Genitourinary Comments: Dilation: 1 Effacement (%): Thick Cervical Position: Posterior Exam by:: e Jennefer Kopp np   Neurological: She is alert and oriented to person, place, and time.  Skin: Skin is warm and dry. She is not diaphoretic.  Psychiatric: She has a normal mood and affect. Her behavior is normal. Judgment and thought content normal.    MAU Course  Procedures Results for orders placed or performed during the hospital encounter of 02/06/18 (from the past 24 hour(s))  Urinalysis, Routine w reflex  microscopic     Status: Abnormal   Collection Time: 02/06/18 11:50 AM  Result Value Ref Range   Color, Urine YELLOW YELLOW   APPearance CLOUDY (A) CLEAR   Specific Gravity, Urine 1.018 1.005 - 1.030   pH 8.0 5.0 - 8.0   Glucose, UA NEGATIVE NEGATIVE mg/dL   Hgb urine dipstick NEGATIVE NEGATIVE   Bilirubin Urine NEGATIVE NEGATIVE   Ketones, ur 5 (A) NEGATIVE mg/dL   Protein, ur 30 (A) NEGATIVE mg/dL   Nitrite POSITIVE (A) NEGATIVE   Leukocytes, UA MODERATE (A) NEGATIVE   RBC / HPF 0-5 0 - 5 RBC/hpf   WBC, UA TOO NUMEROUS TO COUNT 0 - 5 WBC/hpf   Bacteria, UA MANY (A) NONE SEEN   Squamous Epithelial / LPF 6-30 (A) NONE SEEN   WBC Clumps PRESENT    Mucus PRESENT   Wet prep, genital     Status: Abnormal   Collection Time: 02/06/18 12:20 PM  Result Value Ref Range   Yeast Wet Prep HPF POC NONE SEEN NONE SEEN   Trich, Wet Prep NONE SEEN NONE SEEN   Clue Cells Wet Prep HPF POC NONE SEEN NONE SEEN   WBC, Wet Prep HPF POC MODERATE (A) NONE SEEN   Sperm NONE SEEN   POCT fern test     Status: None   Collection Time: 02/06/18 12:51 PM  Result Value Ref Range   POCT Fern Test Negative = intact amniotic membranes    No results found.  MDM NST:  Baseline: 135 bpm, Variability: Good {> 6 bpm), Accelerations: Reactive, Decelerations: Absent and ctx Q2-6 minutes Cervix 1/thick/posterior Reactive NST. BPP 8/8 with normal AFI On exam, no pooling of fluid & fern negative Wet prep negative  C/w Dr. Adrian Blackwater regarding u/a with + nitrites. WIll give IV rocephin here & d/c home with cipro  Patient left AMA prior to receiving IV antibiotics.   Assessment and Plan  A: 1. UTI (urinary tract infection) during pregnancy, third trimester   2. [redacted] weeks gestation of pregnancy   3. Decreased fetal movement affecting management of pregnancy in third trimester, single or unspecified fetus   4. Encounter for suspected PROM, with rupture of membranes not found   5. Left against medical advice     P: Pt left AMA --- states she needs to pick her kids up from school States she may come back this evening   Judeth Horn 02/06/2018, 12:15 PM

## 2018-02-07 ENCOUNTER — Inpatient Hospital Stay (HOSPITAL_COMMUNITY)
Admission: AD | Admit: 2018-02-07 | Discharge: 2018-02-07 | Disposition: A | Discharge status: Home / Self Care | Payer: Medicaid Other | Source: Ambulatory Visit | Attending: Obstetrics & Gynecology | Admitting: Obstetrics & Gynecology

## 2018-02-07 DIAGNOSIS — F319 Bipolar disorder, unspecified: Secondary | ICD-10-CM | POA: Insufficient documentation

## 2018-02-07 DIAGNOSIS — F419 Anxiety disorder, unspecified: Secondary | ICD-10-CM

## 2018-02-07 DIAGNOSIS — Z3689 Encounter for other specified antenatal screening: Secondary | ICD-10-CM

## 2018-02-07 DIAGNOSIS — J45909 Unspecified asthma, uncomplicated: Secondary | ICD-10-CM | POA: Diagnosis not present

## 2018-02-07 DIAGNOSIS — O99343 Other mental disorders complicating pregnancy, third trimester: Secondary | ICD-10-CM | POA: Insufficient documentation

## 2018-02-07 DIAGNOSIS — Z3A35 35 weeks gestation of pregnancy: Secondary | ICD-10-CM | POA: Diagnosis not present

## 2018-02-07 DIAGNOSIS — O2343 Unspecified infection of urinary tract in pregnancy, third trimester: Secondary | ICD-10-CM | POA: Insufficient documentation

## 2018-02-07 DIAGNOSIS — Z87891 Personal history of nicotine dependence: Secondary | ICD-10-CM | POA: Insufficient documentation

## 2018-02-07 DIAGNOSIS — O4703 False labor before 37 completed weeks of gestation, third trimester: Secondary | ICD-10-CM | POA: Diagnosis not present

## 2018-02-07 DIAGNOSIS — Z886 Allergy status to analgesic agent status: Secondary | ICD-10-CM | POA: Diagnosis not present

## 2018-02-07 DIAGNOSIS — Z882 Allergy status to sulfonamides status: Secondary | ICD-10-CM | POA: Diagnosis not present

## 2018-02-07 DIAGNOSIS — Z79899 Other long term (current) drug therapy: Secondary | ICD-10-CM | POA: Insufficient documentation

## 2018-02-07 DIAGNOSIS — O99340 Other mental disorders complicating pregnancy, unspecified trimester: Secondary | ICD-10-CM

## 2018-02-07 DIAGNOSIS — O99513 Diseases of the respiratory system complicating pregnancy, third trimester: Secondary | ICD-10-CM | POA: Insufficient documentation

## 2018-02-07 MED ORDER — NIFEDIPINE 10 MG PO CAPS
10.0000 mg | ORAL_CAPSULE | ORAL | Status: AC
  Administered 2018-02-07 (×3): 10 mg via ORAL
  Filled 2018-02-07 (×3): qty 1

## 2018-02-07 MED ORDER — SODIUM CHLORIDE 0.9 % IV BOLUS (SEPSIS)
1000.0000 mL | Freq: Once | INTRAVENOUS | Status: AC
  Administered 2018-02-07: 1000 mL via INTRAVENOUS

## 2018-02-07 MED ORDER — CEPHALEXIN 500 MG PO CAPS: 500.0000 mg | ORAL_CAPSULE | Freq: Four times a day (QID) | ORAL | 0 refills | Status: AC

## 2018-02-07 MED ORDER — SODIUM CHLORIDE 0.9 % IV SOLN
2.0000 g | Freq: Once | INTRAVENOUS | Status: AC
  Administered 2018-02-07: 2 g via INTRAVENOUS
  Filled 2018-02-07: qty 20

## 2018-02-07 NOTE — MAU Note (Signed)
Pt returns today with worsening contractions and pressure.

## 2018-02-07 NOTE — Discharge Instructions (Signed)
Braxton Hicks Contractions °Contractions of the uterus can occur throughout pregnancy, but they are not always a sign that you are in labor. You may have practice contractions called Braxton Hicks contractions. These false labor contractions are sometimes confused with true labor. °What are Braxton Hicks contractions? °Braxton Hicks contractions are tightening movements that occur in the muscles of the uterus before labor. Unlike true labor contractions, these contractions do not result in opening (dilation) and thinning of the cervix. Toward the end of pregnancy (32-34 weeks), Braxton Hicks contractions can happen more often and may become stronger. These contractions are sometimes difficult to tell apart from true labor because they can be very uncomfortable. You should not feel embarrassed if you go to the hospital with false labor. °Sometimes, the only way to tell if you are in true labor is for your health care provider to look for changes in the cervix. The health care provider will do a physical exam and may monitor your contractions. If you are not in true labor, the exam should show that your cervix is not dilating and your water has not broken. °If there are other health problems associated with your pregnancy, it is completely safe for you to be sent home with false labor. You may continue to have Braxton Hicks contractions until you go into true labor. °How to tell the difference between true labor and false labor °True labor °· Contractions last 30-70 seconds. °· Contractions become very regular. °· Discomfort is usually felt in the top of the uterus, and it spreads to the lower abdomen and low back. °· Contractions do not go away with walking. °· Contractions usually become more intense and increase in frequency. °· The cervix dilates and gets thinner. °False labor °· Contractions are usually shorter and not as strong as true labor contractions. °· Contractions are usually irregular. °· Contractions  are often felt in the front of the lower abdomen and in the groin. °· Contractions may go away when you walk around or change positions while lying down. °· Contractions get weaker and are shorter-lasting as time goes on. °· The cervix usually does not dilate or become thin. °Follow these instructions at home: °· Take over-the-counter and prescription medicines only as told by your health care provider. °· Keep up with your usual exercises and follow other instructions from your health care provider. °· Eat and drink lightly if you think you are going into labor. °· If Braxton Hicks contractions are making you uncomfortable: °? Change your position from lying down or resting to walking, or change from walking to resting. °? Sit and rest in a tub of warm water. °? Drink enough fluid to keep your urine pale yellow. Dehydration may cause these contractions. °? Do slow and deep breathing several times an hour. °· Keep all follow-up prenatal visits as told by your health care provider. This is important. °Contact a health care provider if: °· You have a fever. °· You have continuous pain in your abdomen. °Get help right away if: °· Your contractions become stronger, more regular, and closer together. °· You have fluid leaking or gushing from your vagina. °· You pass blood-tinged mucus (bloody show). °· You have bleeding from your vagina. °· You have low back pain that you never had before. °· You feel your baby’s head pushing down and causing pelvic pressure. °· Your baby is not moving inside you as much as it used to. °Summary °· Contractions that occur before labor are called Braxton   Hicks contractions, false labor, or practice contractions.  Braxton Hicks contractions are usually shorter, weaker, farther apart, and less regular than true labor contractions. True labor contractions usually become progressively stronger and regular and they become more frequent.  Manage discomfort from [facility] contractions by  changing position, resting in a warm bath, drinking plenty of water, or practicing deep breathing. This information is not intended to replace advice given to you by your health care provider. Make sure you discuss any questions you have with your health care provider. Document Released: 04/13/2017 Document Revised: 04/13/2017 Document Reviewed: 04/13/2017 Elsevier Interactive Patient Education  2018 [employer] Pregnancy and Urinary Tract Infection What is a urinary tract infection? A urinary tract infection (UTI) is an infection of any part of the urinary tract. This includes the kidneys, the tubes that connect your kidneys to your bladder (ureters), the bladder, and the tube that carries urine out of your body (urethra). These organs make, store, and get rid of urine in the body. A UTI can be a bladder infection (cystitis) or a kidney infection (pyelonephritis). This infection may be caused by fungi, viruses, and bacteria. Bacteria are the most common cause of UTIs. You are more likely to develop a UTI during pregnancy because:  The physical and hormonal changes your body goes through can make it easier for bacteria to get into your urinary tract.  Your growing baby puts pressure on your uterus and can affect urine flow.  Does a UTI place my baby at risk? An untreated UTI during pregnancy could lead to a kidney infection, which can cause health problems that could affect your baby. Possible complications of an untreated UTI include:  Having your baby before 37 weeks of pregnancy (premature).  Having a baby with a low birth weight.  Developing high blood pressure during pregnancy (preeclampsia).  What are the symptoms of a UTI? Symptoms of a UTI include:  Fever.  Frequent urination or passing small amounts of urine frequently.  Needing to urinate urgently.  Pain or a burning sensation with urination.  Urine that smells bad or unusual.  Cloudy urine.  Pain in the lower  abdomen or back.  Trouble urinating.  Blood in the urine.  Vomiting or being less hungry than normal.  Diarrhea or abdominal pain.  Vaginal discharge.  What are the treatment options for a UTI during pregnancy? Treatment for this condition may include:  Antibiotic medicines that are safe to take during pregnancy.  Other medicines to treat less common causes of UTI.  How can I prevent a UTI?  To prevent a UTI:  Go to the bathroom as soon as you feel the need.  Always wipe from front to back.  Wash your genital area with soap and warm water daily.  Empty your bladder before and after sex.  Wear cotton underwear.  Limit your intake of high sugar foods or drinks, such as regular soda, juice, and sweets.  Drink 6-8 glasses of water daily.  Do not wear tight-fitting pants.  Do not douche or use deodorant sprays.  Do not drink alcohol, caffeine, or carbonated drinks. These can irritate the bladder.  Contact a health care provider if:  Your symptoms do not improve or get worse.  You have a fever after two days of treatment.  You have a rash.  You have abnormal vaginal discharge.  You have back or side pain.  You have chills.  You have nausea and vomiting. Get help right away if: Seek immediate  medical care if you are pregnant and:  You feel contractions in your uterus.  You have lower belly pain.  You have a gush of fluid from your vagina.  You have blood in your urine.  You are vomiting and cannot keep down any medicines or water.  This information is not intended to replace advice given to you by your health care provider. Make sure you discuss any questions you have with your health care provider. Document Released: 03/25/2011 Document Revised: 11/11/2016 Document Reviewed: 10/19/2015 Elsevier Interactive Patient Education  2017 [employer]

## 2018-02-07 NOTE — MAU Provider Note (Signed)
History     CSN: [id number]  Arrival date and time: 02/07/18 1324  Chief Complaint  Patient presents with  . Contractions   [id number] @35 .4 wks here with ctx. Reports ctx started upon waking this am. Frequency is 2-4 min. Reports spotting since last week. No LOF. Reports good FM. Of note, she was seen in MAU yesterday for vaginal discharge and was found to have UTI but left AMA prior to getting IV Rocephin.    OB History    Gravida Para Term Preterm AB Living   4 3 3     3    SAB TAB Ectopic Multiple Live Births           3      Past Medical History:  Diagnosis Date  . Anxiety   . Asthma   . Bipolar 1 disorder (HCC)   . Pregnancy induced hypertension     Past Surgical History:  Procedure Laterality Date  . WISDOM TOOTH EXTRACTION      Family History  Problem Relation Age of Onset  . Hypertension Mother   . Hypertension Father     Social History   Tobacco Use  . Smoking status: Former Smoker    Packs/day: 2.00    Types: Cigarettes    Last attempt to quit: 09/14/2016    Years since quitting: 1.4  . Smokeless tobacco: Never Used  Substance Use Topics  . Alcohol use: No  . Drug use: No    Allergies:  Allergies  Allergen Reactions  . Aspirin Hives  . Ibuprofen Hives  . Sulfa Antibiotics Hives    Medications Prior to Admission  Medication Sig Dispense Refill Last Dose  . Butalbital-APAP-Caffeine 50-325-40 MG capsule TAKE 1 TO 2 CAPSULES BY MOUTH EVERY 6 HOURS AS NEEDED FOR HEADACHE 30 capsule 2 02/06/2018 at Unknown time  . cephALEXin (KEFLEX) 500 MG capsule Take 1 capsule (500 mg total) by mouth 4 (four) times daily. (Patient not taking: Reported on 01/26/2018) 28 capsule 2 Completed Course at Unknown time    Review of Systems  Constitutional: Negative for chills and fever.  Gastrointestinal: Positive for abdominal pain (ctx). Negative for nausea and vomiting.  Genitourinary: Positive for vaginal bleeding (spotting).  Musculoskeletal: Positive for back  pain (lower, bilateral).   Physical Exam   Blood pressure 126/83, pulse (!) 112, temperature 98.6 F (37 C), temperature source Oral, resp. rate 19, height 5\' 6"  (1.676 m), weight 186 lb (84.4 kg), last menstrual period 06/12/2017.  Physical Exam  Nursing note and vitals reviewed. Constitutional: She is oriented to person, place, and time. She appears well-developed and well-nourished. No distress.  HENT:  Head: Normocephalic and atraumatic.  Neck: Normal range of motion.  GI: Soft. She exhibits no distension. There is no tenderness. There is no CVA tenderness.  Gravid   Musculoskeletal: Normal range of motion.       Thoracic back: Normal. She exhibits no tenderness.       Lumbar back: Normal. She exhibits no tenderness.  Neurological: She is alert and oriented to person, place, and time.  Skin: Skin is warm and dry.  Psychiatric: Her mood appears anxious.  EFM: 135 bpm, mod variability, + accels, no decels Toco: 2-4, mild  MAU Course  Procedures Rocephin NS Procardia  MDM No evidence of PTL or pyelo. Ctx have improved after Procardia, pt feels better. Cervix unchanged. Will treat UTI with Keflex. Stable for discharge home.  Assessment and Plan  [redacted] weeks gestation Reactive NST Preterm contractions UTI  in pregnancy  Discharge home Follow up in OB office this week as scheduled Rx Keflex PTL precautions  Allergies as of 02/07/2018      Reactions   Aspirin Hives   Ibuprofen Hives   Sulfa Antibiotics Hives      Medication List    TAKE these medications   Butalbital-APAP-Caffeine 50-325-40 MG capsule TAKE 1 TO 2 CAPSULES BY MOUTH EVERY 6 HOURS AS NEEDED FOR HEADACHE   cephALEXin 500 MG capsule Commonly known as:  KEFLEX Take 1 capsule (500 mg total) by mouth 4 (four) times daily for 7 days.      Donette LarryMelanie Carmilla Granville, CNM 02/07/2018, 10:47 AM

## 2018-02-07 NOTE — MAU Note (Signed)
Urine in lab 

## 2018-02-08 ENCOUNTER — Ambulatory Visit (INDEPENDENT_AMBULATORY_CARE_PROVIDER_SITE_OTHER): Payer: Medicaid Other | Admitting: Obstetrics and Gynecology

## 2018-02-08 ENCOUNTER — Encounter: Payer: Self-pay | Admitting: Obstetrics and Gynecology

## 2018-02-08 VITALS — BP 108/68 | HR 102 | Wt 187.0 lb

## 2018-02-08 DIAGNOSIS — Z3480 Encounter for supervision of other normal pregnancy, unspecified trimester: Secondary | ICD-10-CM

## 2018-02-08 DIAGNOSIS — O2343 Unspecified infection of urinary tract in pregnancy, third trimester: Secondary | ICD-10-CM

## 2018-02-08 DIAGNOSIS — Z3483 Encounter for supervision of other normal pregnancy, third trimester: Secondary | ICD-10-CM

## 2018-02-08 LAB — CULTURE, OB URINE: Culture: >=100000 — AB

## 2018-02-08 NOTE — Progress Notes (Signed)
Prenatal Visit Note Date: 02/08/2018 Clinic: [facility]  Subjective:  Natalia LeatherwoodJennifer Harewood is a 27 y.o. [phone] at 2474w5d being seen today for ongoing prenatal care.  She is currently monitored for the following issues for this low-risk pregnancy and has Supervision of other normal pregnancy, antepartum; History of pre-eclampsia in prior pregnancy, currently pregnant; Anxiety disorder affecting pregnancy, antepartum; Major depressive disorder; and UTI (urinary tract infection) during pregnancy on their problem list.  Patient reports UCs feels less intense, still feels them q2-2938m.   Contractions: Irregular. Vag. Bleeding: None.  Movement: Present. Denies leaking of fluid.   The following portions of the patient's history were reviewed and updated as appropriate: allergies, current medications, past family history, past medical history, past social history, past surgical history and problem list. Problem list updated.  Objective:   Vitals:   02/08/18 1055  BP: 108/68  Pulse: (!) 102  Weight: 187 lb (84.8 kg)    Fetal Status: Fetal Heart Rate (bpm): 140 Fundal Height: 36 cm Movement: Present  Presentation: Vertex  General:  Alert, oriented and cooperative. Patient is in no acute distress.  Skin: Skin is warm and dry. No rash noted.   Cardiovascular: Normal heart rate noted  Respiratory: Normal respiratory effort, no problems with respiration noted  Abdomen: Soft, gravid, appropriate for gestational age. Pain/Pressure: Present     Pelvic:  Cervical exam deferred        Extremities: Normal range of motion.  Edema: None  Mental Status: Normal mood and affect. Normal behavior. Normal judgment and thought content.   Urinalysis:      Assessment and Plan:  Pregnancy: G4P3003 at 1974w5d  1. Supervision of other normal pregnancy, antepartum Note from MAU doesn't state but pt states she was 1cm there.   Confirms on keflex. I told her if she's having to breathe through  contractions and/or becomes more intense to come on in for eval. BTL papers already signed.   Preterm labor symptoms and general obstetric precautions including but not limited to vaginal bleeding, contractions, leaking of fluid and fetal movement were reviewed in detail with the patient. Please refer to After Visit Summary for other counseling recommendations.  Return in about 1 week (around 02/15/2018).   [place] BingPickens, Emersyn Kotarski, MD

## 2018-02-09 ENCOUNTER — Other Ambulatory Visit: Payer: Self-pay | Admitting: Family Medicine

## 2018-02-09 ENCOUNTER — Encounter: Payer: Self-pay | Admitting: Obstetrics & Gynecology

## 2018-02-09 DIAGNOSIS — O2343 Unspecified infection of urinary tract in pregnancy, third trimester: Secondary | ICD-10-CM

## 2018-02-09 MED ORDER — PHENAZOPYRIDINE HCL 200 MG PO TABS: 200.0000 mg | ORAL_TABLET | Freq: Three times a day (TID) | ORAL | 0 refills | Status: DC | PRN

## 2018-02-09 NOTE — Progress Notes (Signed)
Call from [facility] about sx's of UTI--has E.coli--on Keflex (sensitive to this) x 2 days. Rx for pyridium sent to her pharmacy.

## 2018-02-11 ENCOUNTER — Other Ambulatory Visit: Payer: Self-pay

## 2018-02-11 ENCOUNTER — Inpatient Hospital Stay (HOSPITAL_COMMUNITY)
Admission: AD | Admit: 2018-02-11 | Discharge: 2018-02-11 | Disposition: A | Discharge status: Home / Self Care | Payer: Medicaid Other | Source: Ambulatory Visit | Attending: Obstetrics & Gynecology | Admitting: Obstetrics & Gynecology

## 2018-02-11 DIAGNOSIS — M549 Dorsalgia, unspecified: Secondary | ICD-10-CM

## 2018-02-11 DIAGNOSIS — O9989 Other specified diseases and conditions complicating pregnancy, childbirth and the puerperium: Secondary | ICD-10-CM

## 2018-02-11 DIAGNOSIS — Z87891 Personal history of nicotine dependence: Secondary | ICD-10-CM | POA: Insufficient documentation

## 2018-02-11 DIAGNOSIS — Z3A36 36 weeks gestation of pregnancy: Secondary | ICD-10-CM | POA: Insufficient documentation

## 2018-02-11 DIAGNOSIS — R109 Unspecified abdominal pain: Secondary | ICD-10-CM | POA: Diagnosis present

## 2018-02-11 DIAGNOSIS — O99891 Other specified diseases and conditions complicating pregnancy: Secondary | ICD-10-CM

## 2018-02-11 DIAGNOSIS — O2343 Unspecified infection of urinary tract in pregnancy, third trimester: Secondary | ICD-10-CM | POA: Insufficient documentation

## 2018-02-11 LAB — URINALYSIS, ROUTINE W REFLEX MICROSCOPIC
Bilirubin Urine: NEGATIVE
GLUCOSE, UA: NEGATIVE mg/dL
Hgb urine dipstick: NEGATIVE
KETONES UR: NEGATIVE mg/dL
Nitrite: NEGATIVE
PROTEIN: 30 mg/dL — AB
Specific Gravity, Urine: 1.025 (ref 1.005–1.030)
pH: 6 (ref 5.0–8.0)

## 2018-02-11 NOTE — MAU Provider Note (Signed)
Chief Complaint:  Contractions and Back Pain   First Provider Initiated Contact with Patient 02/11/18 1339      HPI: Megan LeatherwoodJennifer Whitehead is a 27 y.o. G4P3003 at 4936w1dwho presents to maternity admissions reporting back pain and cramping abdominal pain daily. She reports the pain started 2 weeks ago and has persisted, unchanged in intensity.  The pain is dull, in the center of her low back and constant and it is associated with intermittent low abdominal pain bilaterally that feels like cramping. It is also associated with intermittent dysuria.  She has tried Tylenol, increased PO fluids, heating pads, and a pregnancy support belt but the pain persists. She was prescribed Keflex for asymptomatic bacteruria on 2/11 and started the meds x 1 day then stopped because she started having dysuria but Keflex was represcribed on 2/27 and she is currently completing the course.  She has Rx for pyridium from her OB office, prescribed on 3/1 but she has not picked up the medication. She has not tried any other treatments. There are no other associated symptoms.  She reports good fetal movement, denies LOF, vaginal bleeding, vaginal itching/burning, h/a, dizziness, n/v, or fever/chills.    HPI  Past Medical History: Past Medical History:  Diagnosis Date  . Anxiety   . Asthma   . Bipolar 1 disorder (HCC)   . Pregnancy induced hypertension     Past obstetric history: OB History  Gravida Para Term Preterm AB Living  4 3 3     3   SAB TAB Ectopic Multiple Live Births          3    # Outcome Date GA Lbr Len/2nd Weight Sex Delivery Anes PTL Lv  4 Current           3 Term 03/13/15 3463w0d  8 lb 1 oz (3.657 kg) M Vag-Spont EPI  LIV  2 Term 07/04/12 5663w0d  6 lb 11 oz (3.033 kg) F Vag-Spont EPI  LIV  1 Term 10/26/10 5864w0d  7 lb 7 oz (3.374 kg) F Vag-Spont EPI  LIV      Past Surgical History: Past Surgical History:  Procedure Laterality Date  . WISDOM TOOTH EXTRACTION      Family History: Family History   Problem Relation Age of Onset  . Hypertension Mother   . Hypertension Father     Social History: Social History   Tobacco Use  . Smoking status: Former Smoker    Packs/day: 2.00    Types: Cigarettes    Last attempt to quit: 09/14/2016    Years since quitting: 1.4  . Smokeless tobacco: Never Used  Substance Use Topics  . Alcohol use: No  . Drug use: No    Allergies:  Allergies  Allergen Reactions  . Aspirin Hives  . Ibuprofen Hives  . Sulfa Antibiotics Hives    Meds:  Medications Prior to Admission  Medication Sig Dispense Refill Last Dose  . Butalbital-APAP-Caffeine 50-325-40 MG capsule TAKE 1 TO 2 CAPSULES BY MOUTH EVERY 6 HOURS AS NEEDED FOR HEADACHE (Patient taking differently: Take 1-2 capsules by mouth every 6 (six) hours as needed for headache. TAKE 1 TO 2 CAPSULES BY MOUTH EVERY 6 HOURS AS NEEDED FOR HEADACHE) 30 capsule 2 02/10/2018 at Unknown time  . cephALEXin (KEFLEX) 500 MG capsule Take 1 capsule (500 mg total) by mouth 4 (four) times daily for 7 days. 28 capsule 0 02/11/2018 at Unknown time  . phenazopyridine (PYRIDIUM) 200 MG tablet Take 1 tablet (200 mg total) by mouth  3 (three) times daily as needed for pain. (Patient not taking: Reported on 02/11/2018) 10 tablet 0 Not Taking at Unknown time    ROS:  Review of Systems  Constitutional: Negative for chills, fatigue and fever.  Eyes: Negative for visual disturbance.  Respiratory: Negative for shortness of breath.   Cardiovascular: Negative for chest pain.  Gastrointestinal: Negative for abdominal pain, nausea and vomiting.  Genitourinary: Negative for difficulty urinating, dysuria, flank pain, pelvic pain, vaginal bleeding, vaginal discharge and vaginal pain.  Neurological: Negative for dizziness and headaches.  Psychiatric/Behavioral: Negative.      I have reviewed patient's Past Medical Hx, Surgical Hx, Family Hx, Social Hx, medications and allergies.   Physical Exam   Patient Vitals for the past 24  hrs:  BP Temp Temp src Pulse Resp SpO2  02/11/18 1317 121/88 97.9 F (36.6 C) Oral (!) 112 - -  02/11/18 1210 - - - - - 98 %  02/11/18 1205 - - - - - 98 %  02/11/18 1200 - - - - - 99 %  02/11/18 1155 131/73 98.9 F (37.2 C) Oral (!) 121 18 99 %   Constitutional: Well-developed, well-nourished female in no acute distress.  Cardiovascular: normal rate Respiratory: normal effort GI: Abd soft, non-tender, gravid appropriate for gestational age.  MS: Extremities nontender, no edema, normal ROM Neurologic: Alert and oriented x 4.  GU: Neg CVAT.   Dilation: 1 Effacement (%): Thick Cervical Position: Posterior Exam by:: Ellender Hose CNM  FHT:  Baseline 145 , moderate variability, accelerations present, no decelerations Contractions: None on toco or to palpation   Labs: Results for orders placed or performed during the hospital encounter of 02/11/18 (from the past 24 hour(s))  Urinalysis, Routine w reflex microscopic     Status: Abnormal   Collection Time: 02/11/18 12:30 PM  Result Value Ref Range   Color, Urine YELLOW YELLOW   APPearance HAZY (A) CLEAR   Specific Gravity, Urine 1.025 1.005 - 1.030   pH 6.0 5.0 - 8.0   Glucose, UA NEGATIVE NEGATIVE mg/dL   Hgb urine dipstick NEGATIVE NEGATIVE   Bilirubin Urine NEGATIVE NEGATIVE   Ketones, ur NEGATIVE NEGATIVE mg/dL   Protein, ur 30 (A) NEGATIVE mg/dL   Nitrite NEGATIVE NEGATIVE   Leukocytes, UA MODERATE (A) NEGATIVE   RBC / HPF 0-5 0 - 5 RBC/hpf   WBC, UA 6-30 0 - 5 WBC/hpf   Bacteria, UA RARE (A) NONE SEEN   Squamous Epithelial / LPF 6-30 (A) NONE SEEN   Mucus PRESENT    AB/Positive/-- (08/31 0000)  Imaging:    MAU Course/MDM: UA NST reviewed and reactive No evidence of preterm labor on exam today Known UTI with delayed start to treatment at home by pt Pt to continue Keflex QID as prescribed and add pyridium for comfort Increase PO fluids F/U in office as scheduled Pt discharge with strict preterm labor  precautions.  Today's evaluation included a work-up for preterm labor which can be life-threatening for both mom and baby.  Assessment: 1. Back pain affecting pregnancy in third trimester   2. Urinary tract infection in mother during third trimester of pregnancy     Plan: Discharge home Labor precautions and fetal kick counts Follow-up Information    [facility] Follow [facility].   Specialty:  Obstetrics and Gynecology Why:  This week as scheduled, return to MAU as needed for emergencies Contact information: [address] [place] [address] [phone]  Allergies as of 02/11/2018      Reactions   Aspirin Hives   Ibuprofen Hives   Sulfa Antibiotics Hives      Medication List    TAKE these medications   Butalbital-APAP-Caffeine 50-325-40 MG capsule TAKE 1 TO 2 CAPSULES BY MOUTH EVERY 6 HOURS AS NEEDED FOR HEADACHE What changed:    how much to take  how to take this  when to take this  reasons to take this  additional instructions   cephALEXin 500 MG capsule Commonly known as:  KEFLEX Take 1 capsule (500 mg total) by mouth 4 (four) times daily for 7 days.   phenazopyridine 200 MG tablet Commonly known as:  PYRIDIUM Take 1 tablet (200 mg total) by mouth 3 (three) times daily as needed for pain.       Sharen Counter Certified Nurse-Midwife 02/11/2018 1:47 PM

## 2018-02-11 NOTE — Progress Notes (Addendum)
G4P3 @ 36.[redacted] wksga. Presents to triage via EMS for r/o ctx with complaints of abdominal and back pain. Pt does NOT know [facility]  Denies LOF or bleeding. +FM   5 days ago dx with UTI  and started on med taht day and quit taking it thinking she had a reaction to it with burning on uriniation on that 1st day.   1215: up to bathroom. Urine sample collection explained and cup given for sample.   1250: pending provider to see pt.   1315: provider at bs assessing pt.   1400: D/c instructions given with pt understanding. Pt left unit via ambulatory.

## 2018-02-13 ENCOUNTER — Other Ambulatory Visit (HOSPITAL_COMMUNITY)
Admission: RE | Admit: 2018-02-13 | Discharge: 2018-02-13 | Disposition: A | Discharge status: Home / Self Care | Payer: Medicaid Other | Source: Ambulatory Visit | Attending: Family Medicine | Admitting: Family Medicine

## 2018-02-13 ENCOUNTER — Ambulatory Visit (INDEPENDENT_AMBULATORY_CARE_PROVIDER_SITE_OTHER): Payer: Medicaid Other | Admitting: Family Medicine

## 2018-02-13 VITALS — BP 112/72 | HR 98 | Wt 185.0 lb

## 2018-02-13 DIAGNOSIS — Z3480 Encounter for supervision of other normal pregnancy, unspecified trimester: Secondary | ICD-10-CM | POA: Diagnosis present

## 2018-02-13 DIAGNOSIS — Z3483 Encounter for supervision of other normal pregnancy, third trimester: Secondary | ICD-10-CM | POA: Insufficient documentation

## 2018-02-13 NOTE — Patient Instructions (Signed)

## 2018-02-13 NOTE — Progress Notes (Signed)
   PRENATAL VISIT NOTE  Subjective:  Megan LeatherwoodJennifer Swaby is a 27 y.o. G4P3003 at 8274w3d being seen today for ongoing prenatal care.  She is currently monitored for the following issues for this low-risk pregnancy and has Supervision of other normal pregnancy, antepartum; History of pre-eclampsia in prior pregnancy, currently pregnant; Anxiety disorder affecting pregnancy, antepartum; Major depressive disorder; and UTI (urinary tract infection) during pregnancy on their problem list.  Patient reports no complaints.  Contractions: Irregular. Vag. Bleeding: None.  Movement: Present. Denies leaking of fluid.   The following portions of the patient's history were reviewed and updated as appropriate: allergies, current medications, past family history, past medical history, past social history, past surgical history and problem list. Problem list updated.  Objective:   Vitals:   02/13/18 1125  BP: 112/72  Pulse: 98  Weight: 185 lb (83.9 kg)    Fetal Status: Fetal Heart Rate (bpm): 142 Fundal Height: 36 cm Movement: Present  Presentation: Vertex  General:  Alert, oriented and cooperative. Patient is in no acute distress.  Skin: Skin is warm and dry. No rash noted.   Cardiovascular: Normal heart rate noted  Respiratory: Normal respiratory effort, no problems with respiration noted  Abdomen: Soft, gravid, appropriate for gestational age.  Pain/Pressure: Present     Pelvic: Cervical exam performed Dilation: 1.5 Effacement (%): 50 Station: -2  Extremities: Normal range of motion.  Edema: None  Mental Status:  Normal mood and affect. Normal behavior. Normal judgment and thought content.   Assessment and Plan:  Pregnancy: G4P3003 at 2874w3d  1. Supervision of other normal pregnancy, antepartum Cultures today - Strep Gp B NAA - GC/Chlamydia probe amp ([place])not at [facility]  Term labor symptoms and general obstetric precautions including but not limited to vaginal bleeding, contractions, leaking of  fluid and fetal movement were reviewed in detail with the patient. Please refer to After Visit Summary for other counseling recommendations.  Return in 1 week (on 02/20/2018).   Reva Boresanya S Donnel Venuto, MD

## 2018-02-14 LAB — GC/CHLAMYDIA PROBE AMP (~~LOC~~) NOT AT ARMC
CHLAMYDIA, DNA PROBE: NEGATIVE
NEISSERIA GONORRHEA: NEGATIVE

## 2018-02-15 LAB — STREP GP B NAA: Strep Gp B NAA: NEGATIVE

## 2018-02-16 ENCOUNTER — Other Ambulatory Visit: Payer: Self-pay

## 2018-02-16 ENCOUNTER — Inpatient Hospital Stay (HOSPITAL_COMMUNITY)
Admission: AD | Admit: 2018-02-16 | Discharge: 2018-02-16 | Disposition: A | Discharge status: Home / Self Care | Payer: Medicaid Other | Source: Ambulatory Visit | Attending: Obstetrics and Gynecology | Admitting: Obstetrics and Gynecology

## 2018-02-16 ENCOUNTER — Encounter (HOSPITAL_COMMUNITY): Payer: Self-pay

## 2018-02-16 DIAGNOSIS — O4703 False labor before 37 completed weeks of gestation, third trimester: Secondary | ICD-10-CM | POA: Diagnosis not present

## 2018-02-16 DIAGNOSIS — Z87891 Personal history of nicotine dependence: Secondary | ICD-10-CM | POA: Insufficient documentation

## 2018-02-16 DIAGNOSIS — O26893 Other specified pregnancy related conditions, third trimester: Secondary | ICD-10-CM | POA: Insufficient documentation

## 2018-02-16 DIAGNOSIS — O99340 Other mental disorders complicating pregnancy, unspecified trimester: Secondary | ICD-10-CM

## 2018-02-16 DIAGNOSIS — Z3A36 36 weeks gestation of pregnancy: Secondary | ICD-10-CM | POA: Insufficient documentation

## 2018-02-16 DIAGNOSIS — N898 Other specified noninflammatory disorders of vagina: Secondary | ICD-10-CM | POA: Insufficient documentation

## 2018-02-16 DIAGNOSIS — O99513 Diseases of the respiratory system complicating pregnancy, third trimester: Secondary | ICD-10-CM | POA: Insufficient documentation

## 2018-02-16 DIAGNOSIS — F419 Anxiety disorder, unspecified: Secondary | ICD-10-CM | POA: Insufficient documentation

## 2018-02-16 DIAGNOSIS — F319 Bipolar disorder, unspecified: Secondary | ICD-10-CM | POA: Insufficient documentation

## 2018-02-16 DIAGNOSIS — O99343 Other mental disorders complicating pregnancy, third trimester: Secondary | ICD-10-CM | POA: Diagnosis not present

## 2018-02-16 DIAGNOSIS — J45909 Unspecified asthma, uncomplicated: Secondary | ICD-10-CM | POA: Insufficient documentation

## 2018-02-16 LAB — WET PREP, GENITAL
Clue Cells Wet Prep HPF POC: NONE SEEN
Sperm: NONE SEEN
Trich, Wet Prep: NONE SEEN
Yeast Wet Prep HPF POC: NONE SEEN

## 2018-02-16 LAB — AMNISURE RUPTURE OF MEMBRANE (ROM) NOT AT ARMC: Amnisure ROM: NEGATIVE

## 2018-02-16 LAB — POCT FERN TEST

## 2018-02-16 NOTE — MAU Note (Signed)
Pt presents with c/o LOF since 0343 this morning and ctxs that began approximately 1 hour ago.

## 2018-02-16 NOTE — MAU Provider Note (Signed)
History     CSN: [id number]  Arrival date and time: 02/16/18 0741   First Provider Initiated Contact with Patient 02/16/18 0813      Chief Complaint  Patient presents with  . Contractions  . Rupture of Membranes   HPI  Ms.  Haile Toppins is a 27 y.o. year old G35P3003 female at [redacted]w[redacted]d weeks gestation who presents to MAU reporting "leaking" fluid since 0330 this morning and contractions x 1 hour. She reports that her contractions "don't hurt that bad". She denies VB. She reports good (+) FM today.  Past Medical History:  Diagnosis Date  . Anxiety   . Asthma   . Bipolar 1 disorder (HCC)   . Pregnancy induced hypertension     Past Surgical History:  Procedure Laterality Date  . WISDOM TOOTH EXTRACTION      Family History  Problem Relation Age of Onset  . Hypertension Mother   . Hypertension Father     Social History   Tobacco Use  . Smoking status: Former Smoker    Packs/day: 2.00    Types: Cigarettes    Last attempt to quit: 09/14/2016    Years since quitting: 1.4  . Smokeless tobacco: Never Used  Substance Use Topics  . Alcohol use: No  . Drug use: No    Allergies:  Allergies  Allergen Reactions  . Aspirin Hives  . Ibuprofen Hives  . Sulfa Antibiotics Hives    Medications Prior to Admission  Medication Sig Dispense Refill Last Dose  . Butalbital-APAP-Caffeine 50-325-40 MG capsule TAKE 1 TO 2 CAPSULES BY MOUTH EVERY 6 HOURS AS NEEDED FOR HEADACHE (Patient taking differently: Take 2 capsules by mouth every 6 (six) hours as needed for headache. TAKE 1 TO 2 CAPSULES BY MOUTH EVERY 6 HOURS AS NEEDED FOR HEADACHE) 30 capsule 2 Past Week at Unknown time  . phenazopyridine (PYRIDIUM) 200 MG tablet Take 1 tablet (200 mg total) by mouth 3 (three) times daily as needed for pain. (Patient not taking: Reported on 02/11/2018) 10 tablet 0 Completed Course at Unknown time    Review of Systems  Constitutional: Negative.   HENT: Negative.   Eyes: Negative.    Respiratory: Negative.   Cardiovascular: Negative.   Gastrointestinal: Positive for abdominal pain (contractions).  Endocrine: Negative.   Genitourinary: Positive for vaginal discharge (leaking).  Musculoskeletal: Negative.   Skin: Negative.   Allergic/Immunologic: Negative.   Neurological: Negative.   Hematological: Negative.   Psychiatric/Behavioral: Negative.    Physical Exam   Blood pressure 128/69, pulse (!) 117, temperature 98.1 F (36.7 C), temperature source Oral, resp. rate 18, height 5\' 6"  (1.676 m), weight 188 lb 4 oz (85.4 kg), last menstrual period 06/12/2017.  Physical Exam  Nursing note and vitals reviewed. Constitutional: She is oriented to person, place, and time. She appears well-developed and well-nourished.  HENT:  Head: Normocephalic and atraumatic.  Eyes: Pupils are equal, round, and reactive to light.  Neck: Normal range of motion.  Cardiovascular: Normal rate, regular rhythm and normal heart sounds.  Respiratory: Effort normal and breath sounds normal.  GI: Soft. Bowel sounds are normal.  Genitourinary:  Genitourinary Comments: Uterus: gravid, S=D, SE: cervix is smooth, pink, no lesions, small amt of thick, mucoid, white vaginal d/c -- WP & amniosure done, closed/long/firm, no CMT or friability, no adnexal tenderness   Musculoskeletal: Normal range of motion.  Neurological: She is alert and oriented to person, place, and time.  Skin: Skin is warm and dry.  Psychiatric: She has a  normal mood and affect. Her behavior is normal. Judgment and thought content normal.    MAU Course  Procedures  MDM Fern slide Amnisure NST - FHR: 145 bpm / moderate variability / accels present / decels absent / TOCO: irregular UCs and UI noted   Results for orders placed or performed during the hospital encounter of 02/16/18 (from the past 24 hour(s))  Fern Test     Status: Normal   Collection Time: 02/16/18  8:05 AM  Result Value Ref Range   POCT Fern Test  Negative    Wet prep, genital     Status: Abnormal   Collection Time: 02/16/18  8:25 AM  Result Value Ref Range   Yeast Wet Prep HPF POC NONE SEEN NONE SEEN   Trich, Wet Prep NONE SEEN NONE SEEN   Clue Cells Wet Prep HPF POC NONE SEEN NONE SEEN   WBC, Wet Prep HPF POC FEW (A) NONE SEEN   Sperm NONE SEEN   Amnisure rupture of membrane (rom)not at [facility]     Status: None   Collection Time: 02/16/18  8:25 AM  Result Value Ref Range   Amnisure ROM NEGATIVE     Assessment and Plan  Vaginal discharge during pregnancy in third trimester  - Information provided on BH ctxs - Reassurance given that vaginal discharge is not amniotic fluid - Discharge patient - Keep scheduled appt at [facility] on 3/12 - Patient verbalized an understanding of the plan of care and agrees.     Raelyn Moraolitta Shametra Cumberland, MSN, CNM 02/16/2018, 8:13 AM

## 2018-02-19 ENCOUNTER — Other Ambulatory Visit: Payer: Self-pay

## 2018-02-19 ENCOUNTER — Telehealth: Payer: Self-pay

## 2018-02-19 ENCOUNTER — Encounter (HOSPITAL_COMMUNITY): Payer: Self-pay | Admitting: *Deleted

## 2018-02-19 ENCOUNTER — Inpatient Hospital Stay (HOSPITAL_COMMUNITY)
Admission: AD | Admit: 2018-02-19 | Discharge: 2018-02-19 | Disposition: A | Discharge status: Home / Self Care | Payer: Medicaid Other | Source: Ambulatory Visit | Attending: Obstetrics & Gynecology | Admitting: Obstetrics & Gynecology

## 2018-02-19 DIAGNOSIS — O212 Late vomiting of pregnancy: Secondary | ICD-10-CM | POA: Diagnosis not present

## 2018-02-19 DIAGNOSIS — O479 False labor, unspecified: Secondary | ICD-10-CM

## 2018-02-19 DIAGNOSIS — F419 Anxiety disorder, unspecified: Secondary | ICD-10-CM | POA: Diagnosis not present

## 2018-02-19 DIAGNOSIS — O99343 Other mental disorders complicating pregnancy, third trimester: Secondary | ICD-10-CM | POA: Insufficient documentation

## 2018-02-19 DIAGNOSIS — O219 Vomiting of pregnancy, unspecified: Secondary | ICD-10-CM | POA: Diagnosis not present

## 2018-02-19 DIAGNOSIS — Z87891 Personal history of nicotine dependence: Secondary | ICD-10-CM | POA: Insufficient documentation

## 2018-02-19 DIAGNOSIS — Z3A37 37 weeks gestation of pregnancy: Secondary | ICD-10-CM | POA: Insufficient documentation

## 2018-02-19 DIAGNOSIS — J45909 Unspecified asthma, uncomplicated: Secondary | ICD-10-CM | POA: Insufficient documentation

## 2018-02-19 DIAGNOSIS — O99513 Diseases of the respiratory system complicating pregnancy, third trimester: Secondary | ICD-10-CM | POA: Diagnosis not present

## 2018-02-19 DIAGNOSIS — O471 False labor at or after 37 completed weeks of gestation: Secondary | ICD-10-CM | POA: Diagnosis not present

## 2018-02-19 DIAGNOSIS — F319 Bipolar disorder, unspecified: Secondary | ICD-10-CM | POA: Diagnosis not present

## 2018-02-19 DIAGNOSIS — R112 Nausea with vomiting, unspecified: Secondary | ICD-10-CM | POA: Diagnosis present

## 2018-02-19 LAB — URINALYSIS, ROUTINE W REFLEX MICROSCOPIC
Bilirubin Urine: NEGATIVE
Glucose, UA: NEGATIVE mg/dL
HGB URINE DIPSTICK: NEGATIVE
Ketones, ur: NEGATIVE mg/dL
Nitrite: NEGATIVE
PROTEIN: 30 mg/dL — AB
RBC / HPF: NONE SEEN RBC/hpf (ref 0–5)
SPECIFIC GRAVITY, URINE: 1.023 (ref 1.005–1.030)
pH: 7 (ref 5.0–8.0)

## 2018-02-19 MED ORDER — ONDANSETRON 8 MG PO TBDP
8.0000 mg | ORAL_TABLET | Freq: Once | ORAL | Status: AC
  Administered 2018-02-19: 8 mg via ORAL
  Filled 2018-02-19: qty 1

## 2018-02-19 NOTE — MAU Note (Signed)
Pt stating that she is not feeling well, she feels dehydrated, N/V all day. Vaginal Pressure, contractions all morning. Denies LOF, VB. Pt states that she has clear mucous discharge x 2 weeks.

## 2018-02-19 NOTE — Discharge Instructions (Signed)
Braxton Hicks Contractions °Contractions of the uterus can occur throughout pregnancy, but they are not always a sign that you are in labor. You may have practice contractions called Braxton Hicks contractions. These false labor contractions are sometimes confused with true labor. °What are Braxton Hicks contractions? °Braxton Hicks contractions are tightening movements that occur in the muscles of the uterus before labor. Unlike true labor contractions, these contractions do not result in opening (dilation) and thinning of the cervix. Toward the end of pregnancy (32-34 weeks), Braxton Hicks contractions can happen more often and may become stronger. These contractions are sometimes difficult to tell apart from true labor because they can be very uncomfortable. You should not feel embarrassed if you go to the hospital with false labor. °Sometimes, the only way to tell if you are in true labor is for your health care provider to look for changes in the cervix. The health care provider will do a physical exam and may monitor your contractions. If you are not in true labor, the exam should show that your cervix is not dilating and your water has not broken. °If there are other health problems associated with your pregnancy, it is completely safe for you to be sent home with false labor. You may continue to have Braxton Hicks contractions until you go into true labor. °How to tell the difference between true labor and false labor °True labor °· Contractions last 30-70 seconds. °· Contractions become very regular. °· Discomfort is usually felt in the top of the uterus, and it spreads to the lower abdomen and low back. °· Contractions do not go away with walking. °· Contractions usually become more intense and increase in frequency. °· The cervix dilates and gets thinner. °False labor °· Contractions are usually shorter and not as strong as true labor contractions. °· Contractions are usually irregular. °· Contractions  are often felt in the front of the lower abdomen and in the groin. °· Contractions may go away when you walk around or change positions while lying down. °· Contractions get weaker and are shorter-lasting as time goes on. °· The cervix usually does not dilate or become thin. °Follow these instructions at home: °· Take over-the-counter and prescription medicines only as told by your health care provider. °· Keep up with your usual exercises and follow other instructions from your health care provider. °· Eat and drink lightly if you think you are going into labor. °· If Braxton Hicks contractions are making you uncomfortable: °? Change your position from lying down or resting to walking, or change from walking to resting. °? Sit and rest in a tub of warm water. °? Drink enough fluid to keep your urine pale yellow. Dehydration may cause these contractions. °? Do slow and deep breathing several times an hour. °· Keep all follow-up prenatal visits as told by your health care provider. This is important. °Contact a health care provider if: °· You have a fever. °· You have continuous pain in your abdomen. °Get help right away if: °· Your contractions become stronger, more regular, and closer together. °· You have fluid leaking or gushing from your vagina. °· You pass blood-tinged mucus (bloody show). °· You have bleeding from your vagina. °· You have low back pain that you never had before. °· You feel your baby’s head pushing down and causing pelvic pressure. °· Your baby is not moving inside you as much as it used to. °Summary °· Contractions that occur before labor are called Braxton   Hicks contractions, false labor, or practice contractions. °· Braxton Hicks contractions are usually shorter, weaker, farther apart, and less regular than true labor contractions. True labor contractions usually become progressively stronger and regular and they become more frequent. °· Manage discomfort from Braxton Hicks contractions by  changing position, resting in a warm bath, drinking plenty of water, or practicing deep breathing. °This information is not intended to replace advice given to you by your health care provider. Make sure you discuss any questions you have with your health care provider. °Document Released: 04/13/2017 Document Revised: 04/13/2017 Document Reviewed: 04/13/2017 °Elsevier Interactive Patient Education © 2018 [employer] Inc[employer] ° °

## 2018-02-19 NOTE — Telephone Encounter (Signed)
Patient called stating she was not feeling like self today. She thinks she is dehydrated and in early labor. I have advised patient to go to MAU to be check out for dehydration as we are not equip to give her IV fluids here in the office. Patient voice understanding at this time.

## 2018-02-19 NOTE — MAU Provider Note (Signed)
History     CSN: [id number]  Arrival date and time: 02/19/18 1534   First Provider Initiated Contact with Patient 02/19/18 1651      No chief complaint on file.  HPI   [name] is a 27 y.o. female [phone] @ 41104w2d here in MAU with N,V and concerns about early labor. Says she thought she should be checked out. Says she has vomited several times today with some diarrhea. Does not feel sick. No one else in the house is sick.  + fetal movement.   OB History    Gravida Para Term Preterm AB Living   4 3 3     3    SAB TAB Ectopic Multiple Live Births           3      Past Medical History:  Diagnosis Date  . Anxiety   . Asthma   . Bipolar 1 disorder (HCC)   . Pregnancy induced hypertension     Past Surgical History:  Procedure Laterality Date  . WISDOM TOOTH EXTRACTION      Family History  Problem Relation Age of Onset  . Hypertension Mother   . Hypertension Father     Social History   Tobacco Use  . Smoking status: Former Smoker    Packs/day: 2.00    Types: Cigarettes    Last attempt to quit: 09/14/2016    Years since quitting: 1.4  . Smokeless tobacco: Never Used  Substance Use Topics  . Alcohol use: No  . Drug use: No    Allergies:  Allergies  Allergen Reactions  . Aspirin Hives  . Ibuprofen Hives  . Sulfa Antibiotics Hives    Medications Prior to Admission  Medication Sig Dispense Refill Last Dose  . butalbital-acetaminophen-caffeine (FIORICET, ESGIC) 50-325-40 MG tablet Take 1 tablet by mouth 2 (two) times daily as needed for headache.   02/19/2018 at Unknown time   Results for orders placed or performed during the hospital encounter of 02/19/18 (from the past 48 hour(s))  Urinalysis, Routine w reflex microscopic     Status: Abnormal   Collection Time: 02/19/18  3:40 PM  Result Value Ref Range   Color, Urine YELLOW YELLOW   APPearance CLOUDY (A) CLEAR   Specific Gravity, Urine 1.023 1.005 - 1.030   pH 7.0 5.0 - 8.0   Glucose, UA  NEGATIVE NEGATIVE mg/dL   Hgb urine dipstick NEGATIVE NEGATIVE   Bilirubin Urine NEGATIVE NEGATIVE   Ketones, ur NEGATIVE NEGATIVE mg/dL   Protein, ur 30 (A) NEGATIVE mg/dL   Nitrite NEGATIVE NEGATIVE   Leukocytes, UA MODERATE (A) NEGATIVE   RBC / HPF NONE SEEN 0 - 5 RBC/hpf   WBC, UA 6-30 0 - 5 WBC/hpf   Bacteria, UA RARE (A) NONE SEEN   Squamous Epithelial / LPF TOO NUMEROUS TO COUNT (A) NONE SEEN   Mucus PRESENT    Amorphous Crystal PRESENT     Comment: Performed at [facility], [address] [place], [place] [address]    Review of Systems  Constitutional: Negative for fever.  Gastrointestinal: Abdominal pain: Contractions   Genitourinary: Positive for pelvic pain. Negative for vaginal bleeding.   Physical Exam   Blood pressure 109/73, pulse (!) 118, temperature 98.8 F (37.1 C), temperature source Oral, resp. rate 18, height 5\' 6"  (1.676 m), last menstrual period 06/12/2017, SpO2 98 %.  Physical Exam  Constitutional: She is oriented to person, place, and time. She appears well-developed and well-nourished. No distress.  HENT:  Head:  Normocephalic.  Eyes: Pupils are equal, round, and reactive to light.  Respiratory: Effort normal.  GI: Soft. She exhibits no distension. There is no tenderness. There is no rebound.  Genitourinary:  Genitourinary Comments: Dilation: 1 Effacement (%): 50 Cervical Position: Posterior Station: Ballotable Presentation: Vertex Exam by:: Lynda Rainwater RN   Musculoskeletal: Normal range of motion.  Neurological: She is alert and oriented to person, place, and time.  Skin: Skin is warm. She is not diaphoretic.  Psychiatric: Her behavior is normal.   Fetal Tracing: Baseline: 135 bpm Variability: Moderate  Accelerations: 15x15 Decelerations: None Toco: None  MAU Course  Procedures  None  MDM  UA with Urine culture   Assessment and Plan   A:  1. False labor   2. Braxton Hicks contractions   3. Nausea and vomiting in  pregnancy     P:  Discharge home with strict return precautions Labor precautions Return to MAU if symptoms worsen Follow up with Office as scheduled Ok to use Zofran that was prescribed to you Increase oral fluids  Rasch, Harolyn Rutherford, NP 02/19/2018 5:19 PM

## 2018-02-19 NOTE — Progress Notes (Signed)
Discharge instructions given and pt verbalizes understanding.

## 2018-02-20 ENCOUNTER — Encounter (HOSPITAL_COMMUNITY): Payer: Self-pay | Admitting: *Deleted

## 2018-02-20 ENCOUNTER — Telehealth (HOSPITAL_COMMUNITY): Payer: Self-pay | Admitting: *Deleted

## 2018-02-20 ENCOUNTER — Ambulatory Visit (INDEPENDENT_AMBULATORY_CARE_PROVIDER_SITE_OTHER): Payer: Medicaid Other | Admitting: Obstetrics and Gynecology

## 2018-02-20 VITALS — BP 104/67 | HR 92 | Wt 188.4 lb

## 2018-02-20 DIAGNOSIS — O2340 Unspecified infection of urinary tract in pregnancy, unspecified trimester: Secondary | ICD-10-CM

## 2018-02-20 DIAGNOSIS — Z3480 Encounter for supervision of other normal pregnancy, unspecified trimester: Secondary | ICD-10-CM

## 2018-02-20 DIAGNOSIS — Z3483 Encounter for supervision of other normal pregnancy, third trimester: Secondary | ICD-10-CM

## 2018-02-20 DIAGNOSIS — O2343 Unspecified infection of urinary tract in pregnancy, third trimester: Secondary | ICD-10-CM

## 2018-02-20 NOTE — Progress Notes (Signed)
Prenatal Visit Note Date: 02/20/2018 Clinic: [facility]  Subjective:  [name] is a 27 y.o. [phone] at 5676w3d being seen today for ongoing prenatal care.  She is currently monitored for the following issues for this low-risk pregnancy and has Supervision of other normal pregnancy, antepartum; History of pre-eclampsia in prior pregnancy, currently pregnant; Anxiety disorder affecting pregnancy, antepartum; Major depressive disorder; UTI (urinary tract infection) during pregnancy; and Vaginal discharge during pregnancy in third trimester on their problem list.  Patient reports still having occasional UCs.   Contractions: Irregular. Vag. Bleeding: None.  Movement: Present. Denies leaking of fluid.   The following portions of the patient's history were reviewed and updated as appropriate: allergies, current medications, past family history, past medical history, past social history, past surgical history and problem list. Problem list updated.  Objective:   Vitals:   02/20/18 1031  BP: 104/67  Pulse: 92  Weight: 188 lb 6.4 oz (85.5 kg)    Fetal Status: Fetal Heart Rate (bpm): 154 Fundal Height: 37 cm Movement: Present  Presentation: Vertex  General:  Alert, oriented and cooperative. Patient is in no acute distress.  Skin: Skin is warm and dry. No rash noted.   Cardiovascular: Normal heart rate noted  Respiratory: Normal respiratory effort, no problems with respiration noted  Abdomen: Soft, gravid, appropriate for gestational age. Pain/Pressure: Present     Pelvic:  Cervical exam deferred        Extremities: Normal range of motion.  Edema: None  Mental Status: Normal mood and affect. Normal behavior. Normal judgment and thought content.   Urinalysis:      Assessment and Plan:  Pregnancy: G4P3003 at 376w3d  1. Urinary tract infection in mother during pregnancy, antepartum toc nv  2. Supervision of other normal pregnancy, antepartum Would still like 39wk  iol due to childcare issues b/c she has relatives several hours away that would have to watch her kids. Will see if we can set that up for her. Still wants btl-->group okay with iol. Scheduled for 39/0  Term labor symptoms and general obstetric precautions including but not limited to vaginal bleeding, contractions, leaking of fluid and fetal movement were reviewed in detail with the patient. Please refer to After Visit Summary for other counseling recommendations.  1wk rtc   Staples BingPickens, Hayes Czaja, MD

## 2018-02-20 NOTE — Telephone Encounter (Signed)
Preadmission screen  

## 2018-02-21 ENCOUNTER — Telehealth (HOSPITAL_COMMUNITY): Payer: Self-pay | Admitting: *Deleted

## 2018-02-21 LAB — CULTURE, OB URINE: Special Requests: NORMAL

## 2018-02-21 NOTE — Telephone Encounter (Signed)
Preadmission screen  

## 2018-02-23 ENCOUNTER — Inpatient Hospital Stay (HOSPITAL_COMMUNITY)
Admission: AD | Admit: 2018-02-23 | Discharge: 2018-02-23 | Disposition: A | Discharge status: Home / Self Care | Payer: Medicaid Other | Source: Ambulatory Visit | Attending: Obstetrics & Gynecology | Admitting: Obstetrics & Gynecology

## 2018-02-23 ENCOUNTER — Encounter (HOSPITAL_COMMUNITY): Payer: Self-pay

## 2018-02-23 DIAGNOSIS — Z3A37 37 weeks gestation of pregnancy: Secondary | ICD-10-CM | POA: Insufficient documentation

## 2018-02-23 DIAGNOSIS — Z3689 Encounter for other specified antenatal screening: Secondary | ICD-10-CM

## 2018-02-23 DIAGNOSIS — Z882 Allergy status to sulfonamides status: Secondary | ICD-10-CM | POA: Diagnosis not present

## 2018-02-23 DIAGNOSIS — Z3A38 38 weeks gestation of pregnancy: Secondary | ICD-10-CM | POA: Diagnosis not present

## 2018-02-23 DIAGNOSIS — Z886 Allergy status to analgesic agent status: Secondary | ICD-10-CM | POA: Insufficient documentation

## 2018-02-23 DIAGNOSIS — O26893 Other specified pregnancy related conditions, third trimester: Secondary | ICD-10-CM

## 2018-02-23 DIAGNOSIS — O99513 Diseases of the respiratory system complicating pregnancy, third trimester: Secondary | ICD-10-CM | POA: Diagnosis not present

## 2018-02-23 DIAGNOSIS — Z87891 Personal history of nicotine dependence: Secondary | ICD-10-CM | POA: Insufficient documentation

## 2018-02-23 DIAGNOSIS — O368130 Decreased fetal movements, third trimester, not applicable or unspecified: Secondary | ICD-10-CM | POA: Insufficient documentation

## 2018-02-23 DIAGNOSIS — O99343 Other mental disorders complicating pregnancy, third trimester: Secondary | ICD-10-CM | POA: Diagnosis not present

## 2018-02-23 DIAGNOSIS — F419 Anxiety disorder, unspecified: Secondary | ICD-10-CM | POA: Insufficient documentation

## 2018-02-23 DIAGNOSIS — O4693 Antepartum hemorrhage, unspecified, third trimester: Secondary | ICD-10-CM | POA: Diagnosis present

## 2018-02-23 DIAGNOSIS — O09893 Supervision of other high risk pregnancies, third trimester: Secondary | ICD-10-CM | POA: Insufficient documentation

## 2018-02-23 DIAGNOSIS — J45909 Unspecified asthma, uncomplicated: Secondary | ICD-10-CM | POA: Diagnosis not present

## 2018-02-23 DIAGNOSIS — N898 Other specified noninflammatory disorders of vagina: Secondary | ICD-10-CM

## 2018-02-23 LAB — URINALYSIS, ROUTINE W REFLEX MICROSCOPIC
Bilirubin Urine: NEGATIVE
GLUCOSE, UA: NEGATIVE mg/dL
Ketones, ur: NEGATIVE mg/dL
Nitrite: NEGATIVE
PROTEIN: NEGATIVE mg/dL
Specific Gravity, Urine: 1.016 (ref 1.005–1.030)
pH: 7 (ref 5.0–8.0)

## 2018-02-23 NOTE — MAU Note (Signed)
Pt states she's also feeling some pain/buring with urination and vaginal itching.

## 2018-02-23 NOTE — MAU Note (Signed)
Pt had not felt baby move since 1900 last night. Had intercourse last night and has had some light bleeding and brown discharge since. Also feeling some cramping 7/10. This morning did have some FM, but was told to come in by the office.

## 2018-02-23 NOTE — MAU Provider Note (Signed)
History     CSN: [id number]  Arrival date and time: 02/23/18 0907   First Provider Initiated Contact with Patient 02/23/18 [phone]      Chief Complaint  Patient presents with  . Decreased Fetal Movement  . Vaginal Bleeding   HPI   Ms.[name] is a 27 y.o. female [phone] @ [redacted]w[redacted]d here in MAU with pink vaginal discharge following intercourse, and decreased fetal movement. Says she first noticed the pink discharge immediatly following intercourse. Since her arrival to MAU she has felt her baby move.   OB History    Gravida Para Term Preterm AB Living   4 3 3     3    SAB TAB Ectopic Multiple Live Births           3      Past Medical History:  Diagnosis Date  . Anxiety   . Asthma   . Bipolar 1 disorder (HCC)   . History of pre-eclampsia   . Pregnancy induced hypertension     Past Surgical History:  Procedure Laterality Date  . WISDOM TOOTH EXTRACTION      Family History  Problem Relation Age of Onset  . Hypertension Mother   . Hypertension Father   . Angina Father     Social History   Tobacco Use  . Smoking status: Former Smoker    Packs/day: 2.00    Types: Cigarettes    Last attempt to quit: 09/14/2016    Years since quitting: 1.4  . Smokeless tobacco: Never Used  Substance Use Topics  . Alcohol use: No  . Drug use: No    Allergies:  Allergies  Allergen Reactions  . Aspirin Hives  . Ibuprofen Hives  . Sulfa Antibiotics Hives    Medications Prior to Admission  Medication Sig Dispense Refill Last Dose  . butalbital-acetaminophen-caffeine (FIORICET, ESGIC) 50-325-40 MG tablet Take 1 tablet by mouth 2 (two) times daily as needed for headache.   Taking   Results for orders placed or performed during the hospital encounter of 02/23/18 (from the past 48 hour(s))  Urinalysis, Routine w reflex microscopic     Status: Abnormal   Collection Time: 02/23/18  9:12 AM  Result Value Ref Range   Color, Urine YELLOW YELLOW   APPearance CLOUDY (A) CLEAR   Specific Gravity, Urine 1.016 1.005 - 1.030   pH 7.0 5.0 - 8.0   Glucose, UA NEGATIVE NEGATIVE mg/dL   Hgb urine dipstick SMALL (A) NEGATIVE   Bilirubin Urine NEGATIVE NEGATIVE   Ketones, ur NEGATIVE NEGATIVE mg/dL   Protein, ur NEGATIVE NEGATIVE mg/dL   Nitrite NEGATIVE NEGATIVE   Leukocytes, UA LARGE (A) NEGATIVE   RBC / HPF 6-30 0 - 5 RBC/hpf   WBC, UA TOO NUMEROUS TO COUNT 0 - 5 WBC/hpf   Bacteria, UA RARE (A) NONE SEEN   Squamous Epithelial / LPF 6-30 (A) NONE SEEN   Mucus PRESENT     Comment: Performed at [facility], [address] [place], [place] [address]    Review of Systems  Constitutional: Negative for fever.  Gastrointestinal: Negative for abdominal pain.  Genitourinary: Positive for vaginal discharge.   Physical Exam   Temperature 98.1 F (36.7 C), temperature source Oral, resp. rate 16, weight 188 lb 8 oz (85.5 kg), last menstrual period 06/12/2017.  Physical Exam  Constitutional: She is oriented to person, place, and time. She appears well-developed and well-nourished. No distress.  HENT:  Head: Normocephalic.  Eyes: Pupils are equal, round, and reactive to  light.  Neck: Neck supple.  GI: Soft. She exhibits no distension. There is no tenderness. There is no rebound and no guarding.  Genitourinary:  Genitourinary Comments: Dilation: 1 Effacement (%): Thick Cervical Position: Middle No pink or bloody discharge noted on exam glove.  Exam by:: J Reakwon Barren NP  Neurological: She is alert and oriented to person, place, and time.  Skin: Skin is warm. She is not diaphoretic.  Psychiatric: Her behavior is normal.   Fetal Tracing: Baseline: 140 bpm Variability: Moderate  Accelerations: 15x15 Decelerations: None Toco: UI  MAU Course  Procedures  None  MDM  + reactive tracing AB positive blood type   Assessment and Plan   A:  1. Decreased fetal movements in third trimester, single or unspecified fetus   2. NST (non-stress test) reactive   3.  Vaginal discharge in pregnancy in third trimester     P:  Discharge home with strict return precautions  Labor precautions Return to MAU if symptoms worsen Kick counts at home  Jeramie Scogin, Harolyn RutherfordJennifer I, NP 02/23/2018 2:21 PM

## 2018-02-23 NOTE — Discharge Instructions (Signed)

## 2018-02-27 ENCOUNTER — Ambulatory Visit (INDEPENDENT_AMBULATORY_CARE_PROVIDER_SITE_OTHER): Payer: Medicaid Other | Admitting: Family Medicine

## 2018-02-27 VITALS — BP 115/79 | HR 100 | Wt 188.1 lb

## 2018-02-27 DIAGNOSIS — Z3480 Encounter for supervision of other normal pregnancy, unspecified trimester: Secondary | ICD-10-CM

## 2018-02-27 DIAGNOSIS — Z3483 Encounter for supervision of other normal pregnancy, third trimester: Secondary | ICD-10-CM

## 2018-02-27 NOTE — Patient Instructions (Addendum)
Third Trimester of Pregnancy The third trimester is from week 28 through week 40 (months 7 through 9). The third trimester is a time when the unborn baby (fetus) is growing rapidly. At the end of the ninth month, the fetus is about 20 inches in length and weighs 6-10 pounds. Body changes during your third trimester Your body will continue to go through many changes during pregnancy. The changes vary from woman to woman. During the third trimester:  Your weight will continue to increase. You can expect to gain 25-35 pounds (11-16 kg) by the end of the pregnancy.  You may begin to get stretch marks on your hips, abdomen, and breasts.  You may urinate more often because the fetus is moving lower into your pelvis and pressing on your bladder.  You may develop or continue to have heartburn. This is caused by increased hormones that slow down muscles in the digestive tract.  You may develop or continue to have constipation because increased hormones slow digestion and cause the muscles that push waste through your intestines to relax.  You may develop hemorrhoids. These are swollen veins (varicose veins) in the rectum that can itch or be painful.  You may develop swollen, bulging veins (varicose veins) in your legs.  You may have increased body aches in the pelvis, back, or thighs. This is due to weight gain and increased hormones that are relaxing your joints.  You may have changes in your hair. These can include thickening of your hair, rapid growth, and changes in texture. Some women also have hair loss during or after pregnancy, or hair that feels dry or thin. Your hair will most likely return to normal after your baby is born.  Your breasts will continue to grow and they will continue to become tender. A yellow fluid (colostrum) may leak from your breasts. This is the first milk you are producing for your baby.  Your belly button may stick out.  You may notice more swelling in your hands,  face, or ankles.  You may have increased tingling or numbness in your hands, arms, and legs. The skin on your belly may also feel numb.  You may feel short of breath because of your expanding uterus.  You may have more problems sleeping. This can be caused by the size of your belly, increased need to urinate, and an increase in your body's metabolism.  You may notice the fetus "dropping," or moving lower in your abdomen (lightening).  You may have increased vaginal discharge.  You may notice your joints feel loose and you may have pain around your pelvic bone.  What to expect at prenatal visits You will have prenatal exams every 2 weeks until week 36. Then you will have weekly prenatal exams. During a routine prenatal visit:  You will be weighed to make sure you and the baby are growing normally.  Your blood pressure will be taken.  Your abdomen will be measured to track your baby's growth.  The fetal heartbeat will be listened to.  Any test results from the previous visit will be discussed.  You may have a cervical check near your due date to see if your cervix has softened or thinned (effaced).  You will be tested for Group B streptococcus. This happens between 35 and 37 weeks.  Your health care provider may ask you:  What your birth plan is.  How you are feeling.  If you are feeling the baby move.  If you have had   any abnormal symptoms, such as leaking fluid, bleeding, severe headaches, or abdominal cramping.  If you are using any tobacco products, including cigarettes, chewing tobacco, and electronic cigarettes.  If you have any questions.  Other tests or screenings that may be performed during your third trimester include:  Blood tests that check for low iron levels (anemia).  Fetal testing to check the health, activity level, and growth of the fetus. Testing is done if you have certain medical conditions or if there are problems during the  pregnancy.  Nonstress test (NST). This test checks the health of your baby to make sure there are no signs of problems, such as the baby not getting enough oxygen. During this test, a belt is placed around your belly. The baby is made to move, and its heart rate is monitored during movement.  What is false labor? False labor is a condition in which you feel small, irregular tightenings of the muscles in the womb (contractions) that usually go away with rest, changing position, or drinking water. These are called Braxton Hicks contractions. Contractions may last for hours, days, or even weeks before true labor sets in. If contractions come at regular intervals, become more frequent, increase in intensity, or become painful, you should see your health care provider. What are the signs of labor?  Abdominal cramps.  Regular contractions that start at 10 minutes apart and become stronger and more frequent with time.  Contractions that start on the top of the uterus and spread down to the lower abdomen and back.  Increased pelvic pressure and dull back pain.  A watery or bloody mucus discharge that comes from the vagina.  Leaking of amniotic fluid. This is also known as your "water breaking." It could be a slow trickle or a gush. Let your health care provider know if it has a color or strange odor. If you have any of these signs, call your health care provider right away, even if it is before your due date. Follow these instructions at home: Medicines  Follow your health care provider's instructions regarding medicine use. Specific medicines may be either safe or unsafe to take during pregnancy.  Take a prenatal vitamin that contains at least 600 micrograms (mcg) of folic acid.  If you develop constipation, try taking a stool softener if your health care provider approves. Eating and drinking  Eat a balanced diet that includes fresh fruits and vegetables, whole grains, good sources of protein  such as meat, eggs, or tofu, and low-fat dairy. Your health care provider will help you determine the amount of weight gain that is right for you.  Avoid raw meat and uncooked cheese. These carry germs that can cause birth defects in the baby.  If you have low calcium intake from food, talk to your health care provider about whether you should take a daily calcium supplement.  Eat four or five small meals rather than three large meals a day.  Limit foods that are high in fat and processed sugars, such as fried and sweet foods.  To prevent constipation: ? Drink enough fluid to keep your urine clear or pale yellow. ? Eat foods that are high in fiber, such as fresh fruits and vegetables, whole grains, and beans. Activity  Exercise only as directed by your health care provider. Most women can continue their usual exercise routine during pregnancy. Try to exercise for 30 minutes at least 5 days a week. Stop exercising if you experience uterine contractions.  Avoid heavy   lifting.  Do not exercise in extreme heat or humidity, or at high altitudes.  Wear low-heel, comfortable shoes.  Practice good posture.  You may continue to have sex unless your health care provider tells you otherwise. Relieving pain and discomfort  Take frequent breaks and rest with your legs elevated if you have leg cramps or low back pain.  Take warm sitz baths to soothe any pain or discomfort caused by hemorrhoids. Use hemorrhoid cream if your health care provider approves.  Wear a good support bra to prevent discomfort from breast tenderness.  If you develop varicose veins: ? Wear support pantyhose or compression stockings as told by your healthcare provider. ? Elevate your feet for 15 minutes, 3-4 times a day. Prenatal care  Write down your questions. Take them to your prenatal visits.  Keep all your prenatal visits as told by your health care provider. This is important. Safety  Wear your seat belt at  all times when driving.  Make a list of emergency phone numbers, including numbers for family, friends, the hospital, and police and fire departments. General instructions  Avoid cat litter boxes and soil used by cats. These carry germs that can cause birth defects in the baby. If you have a cat, ask someone to clean the litter box for you.  Do not travel far distances unless it is absolutely necessary and only with the approval of your health care provider.  Do not use hot tubs, steam rooms, or saunas.  Do not drink alcohol.  Do not use any products that contain nicotine or tobacco, such as cigarettes and e-cigarettes. If you need help quitting, ask your health care provider.  Do not use any medicinal herbs or unprescribed drugs. These chemicals affect the formation and growth of the baby.  Do not douche or use tampons or scented sanitary pads.  Do not cross your legs for long periods of time.  To prepare for the arrival of your baby: ? Take prenatal classes to understand, practice, and ask questions about labor and delivery. ? Make a trial run to the hospital. ? Visit the hospital and tour the maternity area. ? Arrange for maternity or paternity leave through employers. ? Arrange for family and friends to take care of pets while you are in the hospital. ? Purchase a rear-facing car seat and make sure you know how to install it in your car. ? Pack your hospital bag. ? Prepare the baby's nursery. Make sure to remove all pillows and stuffed animals from the baby's crib to prevent suffocation.  Visit your dentist if you have not gone during your pregnancy. Use a soft toothbrush to brush your teeth and be gentle when you floss. Contact a health care provider if:  You are unsure if you are in labor or if your water has broken.  You become dizzy.  You have mild pelvic cramps, pelvic pressure, or nagging pain in your abdominal area.  You have lower back pain.  You have persistent  nausea, vomiting, or diarrhea.  You have an unusual or bad smelling vaginal discharge.  You have pain when you urinate. Get help right away if:  Your water breaks before 37 weeks.  You have regular contractions less than 5 minutes apart before 37 weeks.  You have a fever.  You are leaking fluid from your vagina.  You have spotting or bleeding from your vagina.  You have severe abdominal pain or cramping.  You have rapid weight loss or weight gain.    You have shortness of breath with chest pain.  You notice sudden or extreme swelling of your face, hands, ankles, feet, or legs.  Your baby makes fewer than 10 movements in 2 hours.  You have severe headaches that do not go away when you take medicine.  You have vision changes. Summary  The third trimester is from week 28 through week 40, months 7 through 9. The third trimester is a time when the unborn baby (fetus) is growing rapidly.  During the third trimester, your discomfort may increase as you and your baby continue to gain weight. You may have abdominal, leg, and back pain, sleeping problems, and an increased need to urinate.  During the third trimester your breasts will keep growing and they will continue to become tender. A yellow fluid (colostrum) may leak from your breasts. This is the first milk you are producing for your baby.  False labor is a condition in which you feel small, irregular tightenings of the muscles in the womb (contractions) that eventually go away. These are called Braxton Hicks contractions. Contractions may last for hours, days, or even weeks before true labor sets in.  Signs of labor can include: abdominal cramps; regular contractions that start at 10 minutes apart and become stronger and more frequent with time; watery or bloody mucus discharge that comes from the vagina; increased pelvic pressure and dull back pain; and leaking of amniotic fluid. This information is not intended to replace advice  given to you by your health care provider. Make sure you discuss any questions you have with your health care provider. Document Released: 11/22/2001 Document Revised: 05/05/2016 Document Reviewed: 01/29/2013 Elsevier Interactive Patient Education  2017 [employer]vier [employer]  

## 2018-02-27 NOTE — Progress Notes (Signed)
   PRENATAL VISIT NOTE  Subjective:  [name] is a 27 y.o. G4P3003 at 2415w3d being seen today for ongoing prenatal care.  She is currently monitored for the following issues for this low-risk pregnancy and has Supervision of other normal pregnancy, antepartum; History of pre-eclampsia in prior pregnancy, currently pregnant; Anxiety disorder affecting pregnancy, antepartum; Major depressive disorder; and UTI (urinary tract infection) during pregnancy on their problem list.  Patient reports pelvic pressure.  Contractions: Irregular.  .  Movement: Present. Denies leaking of fluid.   The following portions of the patient's history were reviewed and updated as appropriate: allergies, current medications, past family history, past medical history, past social history, past surgical history and problem list. Problem list updated.  Objective:   Vitals:   02/27/18 1011  BP: 115/79  Pulse: 100  Weight: 188 lb 1.6 oz (85.3 kg)    Fetal Status: Fetal Heart Rate (bpm): 143 Fundal Height: 37 cm Movement: Present  Presentation: Vertex  General:  Alert, oriented and cooperative. Patient is in no acute distress.  Skin: Skin is warm and dry. No rash noted.   Cardiovascular: Normal heart rate noted  Respiratory: Normal respiratory effort, no problems with respiration noted  Abdomen: Soft, gravid, appropriate for gestational age.  Pain/Pressure: Present     Pelvic: Cervical exam performed Dilation: 2 Effacement (%): 50 Station: -2  Extremities: Normal range of motion.  Edema: None  Mental Status:  Normal mood and affect. Normal behavior. Normal judgment and thought content.   Assessment and Plan:  Pregnancy: G4P3003 at 1915w3d  1. Supervision of other normal pregnancy, antepartum Continue routine prenatal care. IOL scheduled at 39 wks  Term labor symptoms and general obstetric precautions including but not limited to vaginal bleeding, contractions, leaking of fluid and fetal movement were  reviewed in detail with the patient. Please refer to After Visit Summary for other counseling recommendations.  Return in 6 weeks (on 04/10/2018) for pp check.   Reva Boresanya S Vidit Boissonneault, MD

## 2018-03-01 ENCOUNTER — Other Ambulatory Visit: Payer: Self-pay

## 2018-03-01 ENCOUNTER — Encounter (HOSPITAL_COMMUNITY): Payer: Self-pay | Admitting: *Deleted

## 2018-03-01 ENCOUNTER — Inpatient Hospital Stay (HOSPITAL_COMMUNITY)
Admission: AD | Admit: 2018-03-01 | Discharge: 2018-03-01 | Disposition: A | Discharge status: Home / Self Care | Payer: Medicaid Other | Source: Ambulatory Visit | Attending: Obstetrics and Gynecology | Admitting: Obstetrics and Gynecology

## 2018-03-01 ENCOUNTER — Other Ambulatory Visit: Payer: Self-pay | Admitting: Obstetrics and Gynecology

## 2018-03-01 DIAGNOSIS — R51 Headache: Secondary | ICD-10-CM | POA: Insufficient documentation

## 2018-03-01 DIAGNOSIS — O479 False labor, unspecified: Secondary | ICD-10-CM

## 2018-03-01 DIAGNOSIS — F419 Anxiety disorder, unspecified: Secondary | ICD-10-CM

## 2018-03-01 DIAGNOSIS — O26893 Other specified pregnancy related conditions, third trimester: Secondary | ICD-10-CM | POA: Insufficient documentation

## 2018-03-01 DIAGNOSIS — Z3A38 38 weeks gestation of pregnancy: Secondary | ICD-10-CM | POA: Diagnosis not present

## 2018-03-01 DIAGNOSIS — O99340 Other mental disorders complicating pregnancy, unspecified trimester: Secondary | ICD-10-CM

## 2018-03-01 MED ORDER — CYCLOBENZAPRINE HCL 10 MG PO TABS
10.0000 mg | ORAL_TABLET | Freq: Once | ORAL | Status: AC
Start: 2018-03-01 — End: 2018-03-01
  Administered 2018-03-01: 10 mg via ORAL
  Filled 2018-03-01: qty 1

## 2018-03-01 MED ORDER — BUTALBITAL-APAP-CAFFEINE 50-325-40 MG PO TABS
2.0000 | ORAL_TABLET | Freq: Once | ORAL | Status: DC
  Filled 2018-03-01: qty 2

## 2018-03-01 MED ORDER — ACETAMINOPHEN 500 MG PO TABS
1000.0000 mg | ORAL_TABLET | Freq: Once | ORAL | Status: AC
  Administered 2018-03-01: 1000 mg via ORAL
  Filled 2018-03-01: qty 2

## 2018-03-01 NOTE — Discharge Instructions (Signed)
Braxton Hicks Contractions °Contractions of the uterus can occur throughout pregnancy, but they are not always a sign that you are in labor. You may have practice contractions called Braxton Hicks contractions. These false labor contractions are sometimes confused with true labor. °What are Braxton Hicks contractions? °Braxton Hicks contractions are tightening movements that occur in the muscles of the uterus before labor. Unlike true labor contractions, these contractions do not result in opening (dilation) and thinning of the cervix. Toward the end of pregnancy (32-34 weeks), Braxton Hicks contractions can happen more often and may become stronger. These contractions are sometimes difficult to tell apart from true labor because they can be very uncomfortable. You should not feel embarrassed if you go to the hospital with false labor. °Sometimes, the only way to tell if you are in true labor is for your health care provider to look for changes in the cervix. The health care provider will do a physical exam and may monitor your contractions. If you are not in true labor, the exam should show that your cervix is not dilating and your water has not broken. °If there are other health problems associated with your pregnancy, it is completely safe for you to be sent home with false labor. You may continue to have Braxton Hicks contractions until you go into true labor. °How to tell the difference between true labor and false labor °True labor °· Contractions last 30-70 seconds. °· Contractions become very regular. °· Discomfort is usually felt in the top of the uterus, and it spreads to the lower abdomen and low back. °· Contractions do not go away with walking. °· Contractions usually become more intense and increase in frequency. °· The cervix dilates and gets thinner. °False labor °· Contractions are usually shorter and not as strong as true labor contractions. °· Contractions are usually irregular. °· Contractions  are often felt in the front of the lower abdomen and in the groin. °· Contractions may go away when you walk around or change positions while lying down. °· Contractions get weaker and are shorter-lasting as time goes on. °· The cervix usually does not dilate or become thin. °Follow these instructions at home: °· Take over-the-counter and prescription medicines only as told by your health care provider. °· Keep up with your usual exercises and follow other instructions from your health care provider. °· Eat and drink lightly if you think you are going into labor. °· If Braxton Hicks contractions are making you uncomfortable: °? Change your position from lying down or resting to walking, or change from walking to resting. °? Sit and rest in a tub of warm water. °? Drink enough fluid to keep your urine pale yellow. Dehydration may cause these contractions. °? Do slow and deep breathing several times an hour. °· Keep all follow-up prenatal visits as told by your health care provider. This is important. °Contact a health care provider if: °· You have a fever. °· You have continuous pain in your abdomen. °Get help right away if: °· Your contractions become stronger, more regular, and closer together. °· You have fluid leaking or gushing from your vagina. °· You pass blood-tinged mucus (bloody show). °· You have bleeding from your vagina. °· You have low back pain that you never had before. °· You feel your baby’s head pushing down and causing pelvic pressure. °· Your baby is not moving inside you as much as it used to. °Summary °· Contractions that occur before labor are called Braxton   Hicks contractions, false labor, or practice contractions. °· Braxton Hicks contractions are usually shorter, weaker, farther apart, and less regular than true labor contractions. True labor contractions usually become progressively stronger and regular and they become more frequent. °· Manage discomfort from Braxton Hicks contractions by  changing position, resting in a warm bath, drinking plenty of water, or practicing deep breathing. °This information is not intended to replace advice given to you by your health care provider. Make sure you discuss any questions you have with your health care provider. °Document Released: 04/13/2017 Document Revised: 04/13/2017 Document Reviewed: 04/13/2017 °Elsevier Interactive Patient Education © 2018 [employer] Inc[employer] ° °

## 2018-03-01 NOTE — MAU Provider Note (Signed)
Medical screen complete [name] is a 27 y.o. female [phone] @ 2075w5d here in MAU with contractions and HA. Says she has had a HA all.   BP normal   Vitals:   03/01/18 1806  BP: 117/80  Pulse: (!) 137  Resp: 18  Temp: 97.9 F (36.6 C)  SpO2: 97%     Flexeril and tylenol given PO RN to check cervix  Duane Lopeasch, Mur[place] I, NP 03/01/2018 8:08 PM

## 2018-03-01 NOTE — Progress Notes (Signed)
Pt reporting headache unrelieved by flexeril and 1 gm Tylenol. Physician ordered 2 Fioricet which patient refused. A few minutes later called RN and said she felt she had a  "racing heart" , was intermittently tachycardic to 150s. Then she said she had some sharp pain in her chest which went away after a few minutes.  Also said she felt:"  exhausted." BP 124/78. Put on 4 L [place]. Provider called to bedside to assess and determined that that she was having anxiety.  Patient offered pain meds again, which she refused.  Patient discharged as she is not making cervical change. She is schedule for IOL on Saturday.

## 2018-03-01 NOTE — MAU Note (Signed)
Pt presents with c/o sharp stabbing intermittent abdominal pain that began @ 1430 this afternoon.  Denies LOF or VB.  Reports +FM.   Pt also reports severe H/A, took prescribed med @ 1500, no relief noted. Reports N&V, denies diarrhea.  States vomited 3x today.

## 2018-03-01 NOTE — MAU Note (Signed)
I have communicated with  Dr Nira Retortegele and reviewed vital signs:  Vitals:   03/01/18 2057 03/01/18 2102  BP:    Pulse:    Resp:    Temp:    SpO2: 100% 100%    Vaginal exam:  Dilation: 3.5 Effacement (%): 60 Station: -2 Presentation: Vertex Exam by:: Velia Meyer. Fatim Vanderschaaf RNC,   Also reviewed contraction pattern and that non-stress test is reactive.  It has been documented that patient is contracting every irregularly every 2-5  minutes with no cervical change over 1 hours not indicating active labor.  Patient denies any other complaints.  Based on this report provider has given order for discharge.  A discharge order and diagnosis entered by a provider.   Labor discharge instructions reviewed with patient.

## 2018-03-02 ENCOUNTER — Inpatient Hospital Stay (HOSPITAL_COMMUNITY): Admission: RE | Admit: 2018-03-02 | Payer: Medicaid Other | Source: Ambulatory Visit

## 2018-03-03 ENCOUNTER — Inpatient Hospital Stay (HOSPITAL_COMMUNITY): Payer: Medicaid Other | Admitting: Anesthesiology

## 2018-03-03 ENCOUNTER — Inpatient Hospital Stay (HOSPITAL_COMMUNITY): Admission: RE | Admit: 2018-03-03 | Payer: Medicaid Other | Source: Ambulatory Visit

## 2018-03-03 ENCOUNTER — Encounter (HOSPITAL_COMMUNITY): Payer: Self-pay

## 2018-03-03 ENCOUNTER — Inpatient Hospital Stay (HOSPITAL_COMMUNITY)
Admission: RE | Admit: 2018-03-03 | Discharge: 2018-03-05 | DRG: 807 | Disposition: A | Discharge status: Home / Self Care | Payer: Medicaid Other | Source: Ambulatory Visit | Attending: Family Medicine | Admitting: Family Medicine

## 2018-03-03 DIAGNOSIS — Z87891 Personal history of nicotine dependence: Secondary | ICD-10-CM | POA: Diagnosis not present

## 2018-03-03 DIAGNOSIS — Z3A39 39 weeks gestation of pregnancy: Secondary | ICD-10-CM | POA: Diagnosis not present

## 2018-03-03 DIAGNOSIS — F419 Anxiety disorder, unspecified: Secondary | ICD-10-CM | POA: Diagnosis present

## 2018-03-03 DIAGNOSIS — O09299 Supervision of pregnancy with other poor reproductive or obstetric history, unspecified trimester: Secondary | ICD-10-CM

## 2018-03-03 DIAGNOSIS — Z3480 Encounter for supervision of other normal pregnancy, unspecified trimester: Secondary | ICD-10-CM

## 2018-03-03 DIAGNOSIS — Z3483 Encounter for supervision of other normal pregnancy, third trimester: Secondary | ICD-10-CM | POA: Diagnosis present

## 2018-03-03 DIAGNOSIS — O99344 Other mental disorders complicating childbirth: Principal | ICD-10-CM | POA: Diagnosis present

## 2018-03-03 DIAGNOSIS — Z3490 Encounter for supervision of normal pregnancy, unspecified, unspecified trimester: Secondary | ICD-10-CM | POA: Diagnosis present

## 2018-03-03 DIAGNOSIS — O99340 Other mental disorders complicating pregnancy, unspecified trimester: Secondary | ICD-10-CM

## 2018-03-03 LAB — RPR: RPR: NONREACTIVE

## 2018-03-03 LAB — CBC
HCT: 35.8 % — ABNORMAL LOW (ref 36.0–46.0)
Hemoglobin: 11.6 g/dL — ABNORMAL LOW (ref 12.0–15.0)
MCH: 26.7 pg (ref 26.0–34.0)
MCHC: 32.4 g/dL (ref 30.0–36.0)
MCV: 82.5 fL (ref 78.0–100.0)
PLATELETS: 283 10*3/uL (ref 150–400)
RBC: 4.34 MIL/uL (ref 3.87–5.11)
RDW: 14.4 % (ref 11.5–15.5)
WBC: 16.9 10*3/uL — ABNORMAL HIGH (ref 4.0–10.5)

## 2018-03-03 LAB — ABO/RH: ABO/RH(D): AB POS

## 2018-03-03 LAB — TYPE AND SCREEN
ABO/RH(D): AB POS
Antibody Screen: NEGATIVE

## 2018-03-03 MED ORDER — BENZOCAINE-MENTHOL 20-0.5 % EX AERO
1.0000 "application " | INHALATION_SPRAY | CUTANEOUS | Status: DC | PRN
  Filled 2018-03-03: qty 56

## 2018-03-03 MED ORDER — PHENYLEPHRINE 40 MCG/ML (10ML) SYRINGE FOR IV PUSH (FOR BLOOD PRESSURE SUPPORT): 80.0000 ug | PREFILLED_SYRINGE | INTRAVENOUS | Status: DC | PRN

## 2018-03-03 MED ORDER — LACTATED RINGERS IV SOLN
INTRAVENOUS | Status: DC
  Administered 2018-03-03 (×3): via INTRAVENOUS

## 2018-03-03 MED ORDER — LIDOCAINE HCL (PF) 1 % IJ SOLN
30.0000 mL | INTRAMUSCULAR | Status: DC | PRN
  Filled 2018-03-03: qty 30

## 2018-03-03 MED ORDER — OXYTOCIN 40 UNITS IN LACTATED RINGERS INFUSION - SIMPLE MED
2.5000 [IU]/h | INTRAVENOUS | Status: DC
  Filled 2018-03-03: qty 1000

## 2018-03-03 MED ORDER — PHENYLEPHRINE 40 MCG/ML (10ML) SYRINGE FOR IV PUSH (FOR BLOOD PRESSURE SUPPORT)
80.0000 ug | PREFILLED_SYRINGE | INTRAVENOUS | Status: DC | PRN
  Filled 2018-03-03: qty 10

## 2018-03-03 MED ORDER — LACTATED RINGERS IV SOLN
500.0000 mL | Freq: Once | INTRAVENOUS | Status: AC
  Administered 2018-03-03: 500 mL via INTRAVENOUS

## 2018-03-03 MED ORDER — FENTANYL CITRATE (PF) 100 MCG/2ML IJ SOLN: 100.0000 ug | INTRAMUSCULAR | Status: DC | PRN

## 2018-03-03 MED ORDER — EPHEDRINE 5 MG/ML INJ: 10.0000 mg | INTRAVENOUS | Status: DC | PRN

## 2018-03-03 MED ORDER — LACTATED RINGERS IV SOLN: 500.0000 mL | Freq: Once | INTRAVENOUS | Status: DC

## 2018-03-03 MED ORDER — PRENATAL MULTIVITAMIN CH
1.0000 | ORAL_TABLET | Freq: Every day | ORAL | Status: DC
  Administered 2018-03-04: 1 via ORAL
  Filled 2018-03-03: qty 1

## 2018-03-03 MED ORDER — ONDANSETRON HCL 4 MG PO TABS: 4.0000 mg | ORAL_TABLET | ORAL | Status: DC | PRN

## 2018-03-03 MED ORDER — OXYTOCIN BOLUS FROM INFUSION
500.0000 mL | Freq: Once | INTRAVENOUS | Status: AC
  Administered 2018-03-03: 500 mL via INTRAVENOUS

## 2018-03-03 MED ORDER — LIDOCAINE HCL (PF) 1 % IJ SOLN
INTRAMUSCULAR | Status: DC | PRN
  Administered 2018-03-03: 6 mL via EPIDURAL

## 2018-03-03 MED ORDER — DIPHENHYDRAMINE HCL 50 MG/ML IJ SOLN: 12.5000 mg | INTRAMUSCULAR | Status: DC | PRN

## 2018-03-03 MED ORDER — MISOPROSTOL 50MCG HALF TABLET: 50.0000 ug | ORAL_TABLET | ORAL | Status: DC | PRN

## 2018-03-03 MED ORDER — OXYCODONE-ACETAMINOPHEN 5-325 MG PO TABS: 2.0000 | ORAL_TABLET | ORAL | Status: DC | PRN

## 2018-03-03 MED ORDER — OXYCODONE-ACETAMINOPHEN 5-325 MG PO TABS: 1.0000 | ORAL_TABLET | ORAL | Status: DC | PRN

## 2018-03-03 MED ORDER — DIPHENHYDRAMINE HCL 25 MG PO CAPS: 25.0000 mg | ORAL_CAPSULE | Freq: Four times a day (QID) | ORAL | Status: DC | PRN

## 2018-03-03 MED ORDER — DIBUCAINE 1 % RE OINT
1.0000 "application " | TOPICAL_OINTMENT | RECTAL | Status: DC | PRN
  Filled 2018-03-03: qty 28

## 2018-03-03 MED ORDER — ONDANSETRON HCL 4 MG/2ML IJ SOLN
4.0000 mg | INTRAMUSCULAR | Status: DC | PRN
  Administered 2018-03-03 – 2018-03-04 (×2): 4 mg via INTRAVENOUS
  Filled 2018-03-03 (×2): qty 2

## 2018-03-03 MED ORDER — LACTATED RINGERS IV SOLN: 500.0000 mL | INTRAVENOUS | Status: DC | PRN

## 2018-03-03 MED ORDER — ZOLPIDEM TARTRATE 5 MG PO TABS: 5.0000 mg | ORAL_TABLET | Freq: Every evening | ORAL | Status: DC | PRN

## 2018-03-03 MED ORDER — IBUPROFEN 600 MG PO TABS: 600.0000 mg | ORAL_TABLET | Freq: Four times a day (QID) | ORAL | Status: DC

## 2018-03-03 MED ORDER — SENNOSIDES-DOCUSATE SODIUM 8.6-50 MG PO TABS
2.0000 | ORAL_TABLET | ORAL | Status: DC
  Administered 2018-03-04: 2 via ORAL
  Filled 2018-03-03: qty 2

## 2018-03-03 MED ORDER — FENTANYL 2.5 MCG/ML BUPIVACAINE 1/10 % EPIDURAL INFUSION (WH - ANES)
14.0000 mL/h | INTRAMUSCULAR | Status: DC | PRN
  Administered 2018-03-03: 14 mL/h via EPIDURAL
  Filled 2018-03-03: qty 100

## 2018-03-03 MED ORDER — ACETAMINOPHEN 325 MG PO TABS: 650.0000 mg | ORAL_TABLET | ORAL | Status: DC | PRN

## 2018-03-03 MED ORDER — OXYTOCIN 40 UNITS IN LACTATED RINGERS INFUSION - SIMPLE MED
1.0000 m[IU]/min | INTRAVENOUS | Status: DC
  Administered 2018-03-03: 2 m[IU]/min via INTRAVENOUS

## 2018-03-03 MED ORDER — COCONUT OIL OIL
1.0000 "application " | TOPICAL_OIL | Status: DC | PRN
  Filled 2018-03-03: qty 120

## 2018-03-03 MED ORDER — WITCH HAZEL-GLYCERIN EX PADS: 1.0000 "application " | MEDICATED_PAD | CUTANEOUS | Status: DC | PRN

## 2018-03-03 MED ORDER — ACETAMINOPHEN 325 MG PO TABS
650.0000 mg | ORAL_TABLET | ORAL | Status: DC | PRN
  Administered 2018-03-03 – 2018-03-05 (×6): 650 mg via ORAL
  Filled 2018-03-03 (×6): qty 2

## 2018-03-03 MED ORDER — SOD CITRATE-CITRIC ACID 500-334 MG/5ML PO SOLN: 30.0000 mL | ORAL | Status: DC | PRN

## 2018-03-03 MED ORDER — SIMETHICONE 80 MG PO CHEW
80.0000 mg | CHEWABLE_TABLET | ORAL | Status: DC | PRN
Start: 2018-03-03 — End: 2018-03-05

## 2018-03-03 MED ORDER — ONDANSETRON HCL 4 MG/2ML IJ SOLN
4.0000 mg | Freq: Four times a day (QID) | INTRAMUSCULAR | Status: DC | PRN
  Administered 2018-03-04: 4 mg via INTRAVENOUS

## 2018-03-03 MED ORDER — TETANUS-DIPHTH-ACELL PERTUSSIS 5-2.5-18.5 LF-MCG/0.5 IM SUSP
0.5000 mL | Freq: Once | INTRAMUSCULAR | Status: DC
  Filled 2018-03-03: qty 0.5

## 2018-03-03 MED ORDER — TERBUTALINE SULFATE 1 MG/ML IJ SOLN: 0.2500 mg | Freq: Once | INTRAMUSCULAR | Status: DC | PRN

## 2018-03-03 NOTE — Progress Notes (Signed)
Megan LeatherwoodJennifer Whitehead is a 27 y.o. G4P3003 at 7156w0d by 14 week ultrasound admitted for social induction with favorable cervix.  Subjective: Pt feeling contractions, some are painful, does not desire medication at this time.  Objective: BP 125/64   Pulse (!) 109   Temp 98.2 F (36.8 C) (Oral)   Resp 16   Ht 5\' 4"  (1.626 m)   Wt 188 lb 8 oz (85.5 kg)   LMP 06/12/2017   BMI 32.36 kg/m  No intake/output data recorded. No intake/output data recorded.  FHT:  FHR: 135 bpm, variability: moderate,  accelerations:  Present,  decelerations:  Absent UC:   regular, every 2 minutes SVE:   Deferred at this time, 2/50/-3 on previous exam  Labs: Lab Results  Component Value Date   WBC 16.9 (H) 03/03/2018   HGB 11.6 (L) 03/03/2018   HCT 35.8 (L) 03/03/2018   MCV 82.5 03/03/2018   PLT 283 03/03/2018    Assessment / Plan: Induction of labor due to social reasons,  progressing well on pitocin  Labor: Progressing normally and will recheck cervix as pt becomes more uncomfortable and/or desires pain management. Preeclampsia:  labs stable Fetal Wellbeing:  Category I Pain Control:  Labor support without medications I/D:  GBS neg Anticipated MOD:  NSVD  Sharen CounterLisa Leftwich-Kirby 03/03/2018, 11:04 AM

## 2018-03-03 NOTE — Progress Notes (Signed)
Called to postpartum room due to maternal hematemesis. Patient was trying to eat and had forceful emesis. Believe she probably had a mucosal tear from forceful emesis. No blood noted in back of throat. No further bleeding on evaluation. VSS. Vaginal bleeding controlled. Last Hbg 11.6. Will continue to monitor. Give antiemetic.  Caryl AdaJazma Phelps, DO OB Fellow Center for [facility], [facility]

## 2018-03-03 NOTE — Progress Notes (Signed)
Megan LeatherwoodJennifer Whitehead is a 27 y.o. [phone] at 4685w0d admitted for IOL  Subjective: Pt comfortable with epidural.  Family in room for support.  Objective: BP (!) 94/50   Pulse (!) 105   Temp 98.2 F (36.8 C) (Oral)   Resp 16   Ht 5\' 4"  (1.626 m)   Wt 188 lb 8 oz (85.5 kg)   LMP 06/12/2017   BMI 32.36 kg/m  No intake/output data recorded. No intake/output data recorded.  FHT:  FHR: 135 bpm, variability: moderate,  accelerations:  Present,  decelerations:  Absent UC:   regular, every 2 minutes SVE:   Dilation: 6 Effacement (%): 70 Station: -1 Exam by:: L Leftwich-Kirby CNM  Labs: Lab Results  Component Value Date   WBC 16.9 (H) 03/03/2018   HGB 11.6 (L) 03/03/2018   HCT 35.8 (L) 03/03/2018   MCV 82.5 03/03/2018   PLT 283 03/03/2018    Assessment / Plan: Induction of labor due to social reasons,  progressing well on pitocin  Labor: Progressing normally Preeclampsia:  n/a Fetal Wellbeing:  Category I Pain Control:  Epidural I/D:  GBS neg Anticipated MOD:  NSVD  Sharen CounterLisa Leftwich-Kirby 03/03/2018, 2:56 PM

## 2018-03-03 NOTE — H&P (Signed)
LABOR AND DELIVERY ADMISSION HISTORY AND PHYSICAL NOTE  [name] is a 27 y.o. female [phone] with IUP at [redacted]w[redacted]d by 14-wk U/S presenting for IOL (electitve d/t childcare issues).  She reports positive fetal movement. She denies leakage of fluid or vaginal bleeding.  Prenatal History/Complications: PNC at [facility] Pregnancy complications:  - Hx of PreE in prior pregnancy - UTI - Hx of depression/anxiety  Past Medical History: Past Medical History:  Diagnosis Date  . Anxiety   . Asthma   . Bipolar 1 disorder (HCC)   . History of pre-eclampsia   . Pregnancy induced hypertension     Past Surgical History: Past Surgical History:  Procedure Laterality Date  . WISDOM TOOTH EXTRACTION      Obstetrical History: OB History    Gravida  4   Para  3   Term  3   Preterm      AB      Living  3     SAB      TAB      Ectopic      Multiple      Live Births  3           Social History: Social History   Socioeconomic History  . Marital status: Single    Spouse name: Not on file  . Number of children: Not on file  . Years of education: Not on file  . Highest education level: Not on file  Occupational History  . Not on file  Social Needs  . Financial resource strain: Not on file  . Food insecurity:    Worry: Not on file    Inability: Not on file  . Transportation needs:    Medical: Not on file    Non-medical: Not on file  Tobacco Use  . Smoking status: Former Smoker    Packs/day: 2.00    Types: Cigarettes    Last attempt to quit: 09/14/2016    Years since quitting: 1.4  . Smokeless tobacco: Never Used  Substance and Sexual Activity  . Alcohol use: No  . Drug use: No  . Sexual activity: Yes    Birth control/protection: None  Lifestyle  . Physical activity:    Days per week: Not on file    Minutes per session: Not on file  . Stress: Not on file  Relationships  . Social connections:    Talks on phone: Not on file    Gets together:  Not on file    Attends religious service: Not on file    Active member of club or organization: Not on file    Attends meetings of clubs or organizations: Not on file    Relationship status: Not on file  Other Topics Concern  . Not on file  Social History Narrative  . Not on file    Family History: Family History  Problem Relation Age of Onset  . Hypertension Mother   . Hypertension Father   . Angina Father     Allergies: Allergies  Allergen Reactions  . Aspirin Hives  . Ibuprofen Hives  . Sulfa Antibiotics Hives     (Not in a hospital admission)   Review of Systems  All systems reviewed and negative except as stated in HPI  Physical Exam Blood pressure 126/82, pulse (!) 114, temperature 98.2 F (36.8 C), temperature source Oral, resp. rate 16, height 5\' 4"  (1.626 m), weight 188 lb 8 oz (85.5 kg), last menstrual period 06/12/2017. General appearance:  alert, oriented, NAD Lungs: normal respiratory effort Heart: regular rate Abdomen: soft, non-tender; gravid, FH appropriate for GA Extremities: No calf swelling or tenderness  Fetal monitoring: baseline rate 145, mod variability, + acel, no decel Uterine activity: UI    Prenatal labs: ABO, Rh: AB/Positive/-- (08/31 0000) Antibody: Negative (08/31 0000) Rubella: 1.42 (08/31 0000) RPR: Non Reactive (01/03 0940)  HBsAg: Negative (08/31 0000)  HIV: Non Reactive (01/03 0940)  GC/Chlamydia: negative (3/5) GBS: Negative (03/05 1145)  2-hr GTT: normal Genetic screening:  Negative Quad screen Anatomy US: Normal anatomy  Prenatal Transfer Tool  Maternal Diabetes: No Genetic Screening: Normal Maternal Ultrasounds/Referrals: Normal Fetal Ultrasounds or other Referrals:  None Maternal Substance Abuse:  No Significant Maternal Medications:  Meds include: Other: Fioricet Significant Maternal Lab Results: Lab values include: Group B Strep negative  No results found for this or any previous visit (from the past 24  hour(s)).   Assessment: Megan LeatherwoodJennifer Whitehead is a 27 y.o. G4P3003 at 5864w0d here for IOL.  #Labor: ripe cervix (cervix 3.5/60%/-2 2 days ago); start IV Pitocin #Pain: Per patient's request #FWB: Cat I #ID:  GBS negative #MOF: Bottle #MOC:BTL (Medicaid papers signed 12/29/17) #Circ:  outpatient  Megan NicolasJulie P Kashawna Whitehead 03/03/2018, 8:36 AM

## 2018-03-03 NOTE — Anesthesia Preprocedure Evaluation (Signed)
Anesthesia Evaluation  Patient identified by MRN, date of birth, ID band Patient awake    Reviewed: Allergy & Precautions, NPO status , Patient's Chart, lab work & pertinent test results  Airway Mallampati: II  TM Distance: >3 FB Neck ROM: Full    Dental no notable dental hx.    Pulmonary neg pulmonary ROS, former smoker,    Pulmonary exam normal breath sounds clear to auscultation       Cardiovascular negative cardio ROS Normal cardiovascular exam Rhythm:Regular Rate:Normal     Neuro/Psych negative neurological ROS     GI/Hepatic   Endo/Other    Renal/GU      Musculoskeletal   Abdominal   Peds  Hematology   Anesthesia Other Findings   Reproductive/Obstetrics (+) Pregnancy                             This SmartLink has not been configured with any valid records.   Lab Results  Component Value Date   WBC 16.9 (H) 03/03/2018   HGB 11.6 (L) 03/03/2018   HCT 35.8 (L) 03/03/2018   MCV 82.5 03/03/2018   PLT 283 03/03/2018    Anesthesia Physical Anesthesia Plan  ASA: II  Anesthesia Plan: Epidural   Post-op Pain Management:    Induction:   PONV Risk Score and Plan:   Airway Management Planned:   Additional Equipment:   Intra-op Plan:   Post-operative Plan:   Informed Consent:   Plan Discussed with:   Anesthesia Plan Comments:         Anesthesia Quick Evaluation

## 2018-03-03 NOTE — Progress Notes (Signed)
[name] is a 27 y.o. G4P3003 at 3468w0d admitted for IOL  Subjective: Pt comfortable with epidural. Sleeping when CNM entered the room. Family in room for support.  Objective: BP 118/73   Pulse (!) 126   Temp (!) 97.5 F (36.4 C) (Oral)   Resp 20   Ht 5\' 4"  (1.626 m)   Wt 188 lb 8 oz (85.5 kg)   LMP 06/12/2017   BMI 32.36 kg/m  No intake/output data recorded. Total I/O In: -  Out: 250 [Urine:250]  FHT:  FHR: 135 bpm, variability: moderate,  accelerations:  Present,  decelerations:  Absent UC:   regular, every 2 minutes SVE:   Dilation: (P) 10 Effacement (%): 90 Station: -1 Exam by:: s grindstaff rn  Labs: Lab Results  Component Value Date   WBC 16.9 (H) 03/03/2018   HGB 11.6 (L) 03/03/2018   HCT 35.8 (L) 03/03/2018   MCV 82.5 03/03/2018   PLT 283 03/03/2018    Assessment / Plan: Induction of labor due to social reasons,  progressing well on pitocin  Labor: Test push with pt, pt with limited sensation, not moving baby very well.  Pt desires to push now and is multip so will continue to push with RN. Preeclampsia:  n/a Fetal Wellbeing:  Category I Pain Control:  Epidural I/D:  GBS neg Anticipated MOD:  NSVD  Sharen CounterLisa Leftwich-Kirby 03/03/2018, 7:53 PM

## 2018-03-03 NOTE — Progress Notes (Signed)
Megan LeatherwoodJennifer Whitehead is a 27 y.o. G4P3003 at 1661w0d admitted for induction of labor.  Subjective: Pt breathing with contractions, family in room for support. Pt considering epidural now.   Objective: BP 127/80   Pulse (!) 118   Temp 98.3 F (36.8 C) (Oral)   Resp 16   Ht 5\' 4"  (1.626 m)   Wt 188 lb 8 oz (85.5 kg)   LMP 06/12/2017   BMI 32.36 kg/m  No intake/output data recorded. No intake/output data recorded.  FHT:  FHR: 135 bpm, variability: moderate,  accelerations:  Present,  decelerations:  Absent UC:   regular, every 2 minutes SVE:   Dilation: 4 Effacement (%): 50 Station: -2 Exam by:: L Leftowiith Kirby CNM   Attempt to AROM, scant fluid return, ? ROM Pt uncomfortable with exam so will reevaluate after epidural  Labs: Lab Results  Component Value Date   WBC 16.9 (H) 03/03/2018   HGB 11.6 (L) 03/03/2018   HCT 35.8 (L) 03/03/2018   MCV 82.5 03/03/2018   PLT 283 03/03/2018    Assessment / Plan: Induction of labor due to social reasons,  progressing well on pitocin  Labor: Progressing normally.  AROM? See above note. Will reevaluate after epidural. Preeclampsia:  n/a Fetal Wellbeing:  Category I Pain Control:  Labor support without medications I/D:  GBS neg Anticipated MOD:  NSVD  Sharen CounterLisa Leftwich-Kirby 03/03/2018, 12:24 PM

## 2018-03-03 NOTE — Anesthesia Pain Management Evaluation Note (Signed)
  CRNA Pain Management Visit Note  Patient: Natalia LeatherwoodJennifer Montrose, 27 y.o., female  "Hello [facility] am a [facility]. We have an anesthesia team available at all times to provide care throughout the hospital, including epidural management and anesthesia for C-section. I don't know your plan for the delivery whether it a natural birth, water birth, IV sedation, nitrous supplementation, doula or epidural, but we want to meet your pain goals."   1.Was your pain managed to your expectations on prior hospitalizations?   Yes   2.What is your expectation for pain management during this hospitalization?     Epidural  3.How can we help you reach that goal? unsure  Record the patient's initial score and the patient's pain goal.   Pain: 0  Pain Goal: 10 [facility] wants you to be able to say your pain was always managed very well.  Cephus ShellingBURGER,Shareese Macha 03/03/2018

## 2018-03-03 NOTE — Anesthesia Procedure Notes (Signed)
Epidural Patient location during procedure: OB Start time: 03/03/2018 2:17 PM End time: 03/03/2018 2:30 PM  Staffing Anesthesiologist: Trevor IhaHouser, Sharilyn Geisinger A, MD Performed: anesthesiologist   Preanesthetic Checklist Completed: patient identified, site marked, surgical consent, pre-op evaluation, timeout performed, IV checked, risks and benefits discussed and monitors and equipment checked  Epidural Patient position: sitting Prep: site prepped and draped and DuraPrep Patient monitoring: continuous pulse ox and blood pressure Approach: midline Location: L3-L4 Injection technique: LOR air  Needle:  Needle type: Tuohy  Needle gauge: 17 G Needle length: 9 cm and 9 Needle insertion depth: 5 cm cm Catheter type: closed end flexible Catheter size: 19 Gauge Catheter at skin depth: 10 cm Test dose: negative  Assessment Events: blood not aspirated, injection not painful, no injection resistance, negative IV test and no paresthesia

## 2018-03-04 ENCOUNTER — Encounter (HOSPITAL_COMMUNITY): Payer: Self-pay

## 2018-03-04 ENCOUNTER — Inpatient Hospital Stay (HOSPITAL_COMMUNITY): Payer: Medicaid Other | Admitting: Anesthesiology

## 2018-03-04 ENCOUNTER — Encounter (HOSPITAL_COMMUNITY)
Admission: RE | Disposition: A | Discharge status: Home / Self Care | Payer: Self-pay | Source: Ambulatory Visit | Attending: Family Medicine

## 2018-03-04 HISTORY — PX: TUBAL LIGATION: SHX77

## 2018-03-04 SURGERY — LIGATION, FALLOPIAN TUBE, POSTPARTUM
Anesthesia: Epidural | Wound class: Clean Contaminated

## 2018-03-04 MED ORDER — OXYCODONE HCL 5 MG/5ML PO SOLN: 5.0000 mg | Freq: Once | ORAL | Status: DC | PRN

## 2018-03-04 MED ORDER — LACTATED RINGERS IV SOLN: INTRAVENOUS | Status: DC

## 2018-03-04 MED ORDER — MIDAZOLAM HCL 2 MG/2ML IJ SOLN
INTRAMUSCULAR | Status: AC
  Filled 2018-03-04: qty 2

## 2018-03-04 MED ORDER — SODIUM BICARBONATE 8.4 % IV SOLN
INTRAVENOUS | Status: AC
  Filled 2018-03-04: qty 50

## 2018-03-04 MED ORDER — LIDOCAINE-EPINEPHRINE (PF) 2 %-1:200000 IJ SOLN
INTRAMUSCULAR | Status: AC
  Filled 2018-03-04: qty 20

## 2018-03-04 MED ORDER — PROPOFOL 10 MG/ML IV BOLUS
INTRAVENOUS | Status: AC
  Filled 2018-03-04: qty 20

## 2018-03-04 MED ORDER — SODIUM BICARBONATE 8.4 % IV SOLN
INTRAVENOUS | Status: DC | PRN
  Administered 2018-03-04 (×4): 5 mL via EPIDURAL

## 2018-03-04 MED ORDER — FENTANYL CITRATE (PF) 250 MCG/5ML IJ SOLN
INTRAMUSCULAR | Status: AC
  Filled 2018-03-04: qty 5

## 2018-03-04 MED ORDER — ONDANSETRON HCL 4 MG/2ML IJ SOLN
INTRAMUSCULAR | Status: AC
  Filled 2018-03-04: qty 2

## 2018-03-04 MED ORDER — FAMOTIDINE 20 MG PO TABS
40.0000 mg | ORAL_TABLET | Freq: Once | ORAL | Status: AC
  Administered 2018-03-04: 40 mg via ORAL
  Filled 2018-03-04: qty 2

## 2018-03-04 MED ORDER — ONDANSETRON HCL 4 MG/2ML IJ SOLN: 4.0000 mg | Freq: Once | INTRAMUSCULAR | Status: DC

## 2018-03-04 MED ORDER — PHENYLEPHRINE 40 MCG/ML (10ML) SYRINGE FOR IV PUSH (FOR BLOOD PRESSURE SUPPORT)
PREFILLED_SYRINGE | INTRAVENOUS | Status: AC
  Filled 2018-03-04: qty 10

## 2018-03-04 MED ORDER — BUPIVACAINE HCL (PF) 0.5 % IJ SOLN
INTRAMUSCULAR | Status: AC
  Filled 2018-03-04: qty 30

## 2018-03-04 MED ORDER — OXYCODONE HCL 5 MG PO TABS: 5.0000 mg | ORAL_TABLET | Freq: Once | ORAL | Status: DC | PRN

## 2018-03-04 MED ORDER — PHENYLEPHRINE HCL 10 MG/ML IJ SOLN
INTRAMUSCULAR | Status: DC | PRN
  Administered 2018-03-04: 80 ug via INTRAVENOUS

## 2018-03-04 MED ORDER — PROMETHAZINE HCL 25 MG/ML IJ SOLN: 6.2500 mg | INTRAMUSCULAR | Status: DC | PRN

## 2018-03-04 MED ORDER — LIDOCAINE HCL (CARDIAC) 20 MG/ML IV SOLN
INTRAVENOUS | Status: AC
  Filled 2018-03-04: qty 5

## 2018-03-04 MED ORDER — METOCLOPRAMIDE HCL 10 MG PO TABS
10.0000 mg | ORAL_TABLET | Freq: Once | ORAL | Status: AC
  Administered 2018-03-04: 10 mg via ORAL
  Filled 2018-03-04: qty 1

## 2018-03-04 MED ORDER — HYDROMORPHONE HCL 1 MG/ML IJ SOLN: 0.2500 mg | INTRAMUSCULAR | Status: DC | PRN

## 2018-03-04 MED ORDER — MEPERIDINE HCL 25 MG/ML IJ SOLN: 6.2500 mg | INTRAMUSCULAR | Status: DC | PRN

## 2018-03-04 MED ORDER — ONDANSETRON HCL 4 MG/2ML IJ SOLN
INTRAMUSCULAR | Status: AC
  Administered 2018-03-04: 4 mg via INTRAVENOUS
  Filled 2018-03-04: qty 2

## 2018-03-04 NOTE — Progress Notes (Signed)
CSW attempted to meet with patient to discuss support system, but patient has been in PACU for several hours. CSW spoke with Paige, AC / RN to discuss the reason for consult and to identify social supports. Paige stated that patient has a fiance that has been present with her during her admission at [facility]. Per Paige, AC / RN patient received additional dosing of an epidural this morning and is currently recovering from that anesthesia.   CSW placed call to Ashley, RN to discuss social supports. Ashley, RN reported that she has not had any concerns for this patient and her fiance has been present. CSW and RN agreed to screen out consult. CSW encouraged RN to place additional consult if other needs arise.  Kerby Hockley, MSW, LCSW-A Clinical Social Worker [facility] [phone]   

## 2018-03-04 NOTE — Progress Notes (Signed)
Patient seen for scheduled BTL. Attempted to review risks/benefits of permanent sterilization with patient including infection, hemorrhage, damage to surrounding tissue and organs, risk of pregnancy. She verbalized understanding of all of the above but would not open eyes when asked to. When asked why not, patient reported feeling "drunk." Per RN, patient had not had any kind of pain medicine or sedation. Patient then asked if she could change her mind regarding BTL. I informed patient that we would postpone procedure until she was not feeling drunk to discuss risks/benefits and she was agreeable. Staff informed that procedure was cancelled.   Baldemar LenisK. Meryl Kushal Saunders, M.D. Attending Obstetrician & Gynecologist, [facility], [facility]

## 2018-03-04 NOTE — Plan of Care (Signed)
Pt ambulating in room without difficulty or assistance.  Pt denies dizziness with standing.  Pt reports full sensation in BLE.  Pt instructed to sit and call RN if she feels dizzy at all.  Pt verbalized understanding.

## 2018-03-04 NOTE — Anesthesia Preprocedure Evaluation (Signed)
Anesthesia Evaluation  Patient identified by MRN, date of birth, ID band Patient awake    Reviewed: Allergy & Precautions, NPO status , Patient's Chart, lab work & pertinent test results  Airway Mallampati: II  TM Distance: >3 FB Neck ROM: Full    Dental no notable dental hx.    Pulmonary asthma , former smoker,    Pulmonary exam normal breath sounds clear to auscultation       Cardiovascular hypertension, negative cardio ROS Normal cardiovascular exam Rhythm:Regular Rate:Normal     Neuro/Psych negative neurological ROS     GI/Hepatic   Endo/Other    Renal/GU      Musculoskeletal   Abdominal   Peds  Hematology   Anesthesia Other Findings   Reproductive/Obstetrics (+) Pregnancy                             This SmartLink has not been configured with any valid records.   Lab Results  Component Value Date   WBC 16.9 (H) 03/03/2018   HGB 11.6 (L) 03/03/2018   HCT 35.8 (L) 03/03/2018   MCV 82.5 03/03/2018   PLT 283 03/03/2018    Anesthesia Physical  Anesthesia Plan  ASA: II  Anesthesia Plan: Epidural   Post-op Pain Management:    Induction:   PONV Risk Score and Plan: 4 or greater and Ondansetron, Dexamethasone, Midazolam and Scopolamine patch - Pre-op  Airway Management Planned:   Additional Equipment:   Intra-op Plan:   Post-operative Plan:   Informed Consent: I have reviewed the patients History and Physical, chart, labs and discussed the procedure including the risks, benefits and alternatives for the proposed anesthesia with the patient or authorized representative who has indicated his/her understanding and acceptance.   Dental advisory given  Plan Discussed with: CRNA  Anesthesia Plan Comments:         Anesthesia Quick Evaluation

## 2018-03-04 NOTE — Progress Notes (Signed)
0843 500cc LR bolus given for low blood pressure as advised by CRNA, Lafayette DragonL Burger, present at bs.

## 2018-03-04 NOTE — Progress Notes (Signed)
[id number] pt states she has to go to the bathroom. Upon assisting pt on bedpan. Pt noted to have passed moderate amount of loose bowel movement. Pt states she now feels much better. Pt appears more calm. VS more stable. Pt given pericare and linen change and assisted to comfortable position. Dr Earlene Plateravis updated on pt status and order received for Intermittent urinary catheter x1 before PACU discharge.

## 2018-03-04 NOTE — Progress Notes (Signed)
0945 pt assisted to more comfortable position to rest. Then patient c/o nausea and begins to moan and state she does not feel well. Dr Bretta BangGermaroth called and updated pt continues to be unable to move lower extremities, has BP 92/65, has been given 1 dose of pressors, a 500cc bolus of LR and reports not feeling well. Order to give Zofran, an additional LR 500cc bolus, hold pressor for now and monitor patient.

## 2018-03-04 NOTE — Anesthesia Postprocedure Evaluation (Signed)
Anesthesia Post Note  Patient: Megan LeatherwoodJennifer Wecker  Procedure(s) Performed: AN AD HOC LABOR EPIDURAL     Patient location during evaluation: Mother Baby Anesthesia Type: Epidural Level of consciousness: awake Pain management: satisfactory to patient Vital Signs Assessment: post-procedure vital signs reviewed and stable Respiratory status: spontaneous breathing Cardiovascular status: stable Anesthetic complications: no    Last Vitals:  Vitals:   03/04/18 1045 03/04/18 1100  BP: 102/67 106/71  Pulse: (!) 107 (!) 107  Resp: (!) 21 18  Temp:  37.2 C  SpO2: 99% 99%    Last Pain:  Vitals:   03/04/18 1220  TempSrc:   PainSc: 0-No pain   Pain Goal:                 KeyCorpBURGER,Teresa Lemmerman

## 2018-03-04 NOTE — Transfer of Care (Signed)
Immediate Anesthesia Transfer of Care Note  Patient: Megan LeatherwoodJennifer Whitehead  Procedure(s) Performed: POST PARTUM TUBAL LIGATION (N/A )  Patient Location: PACU  Anesthesia Type:Epidural  Level of Consciousness: awake and sedated  Airway & Oxygen Therapy: Patient Spontanous Breathing  Post-op Assessment: Report given to RN  Post vital signs: Reviewed and stable  Last Vitals:  Vitals Value Taken Time  BP    Temp    Pulse    Resp    SpO2      Last Pain:  Vitals:   03/04/18 0746  TempSrc:   PainSc: 0-No pain         Complications: No apparent anesthesia complications

## 2018-03-04 NOTE — Progress Notes (Signed)
Faculty Attending Note  Post Partum Day 1  Subjective: Patient is feeling well. She reports well controlled pain on PO pain meds. She is ambulating and denies light-headedness or dizziness. She is not passing flatus. She is tolerating a regular diet without nausea/vomiting. Bleeding is moderate. She is bottle feeding. Baby is in nursery and doing well.  Objective: Blood pressure 106/71, pulse (!) 107, temperature 98.9 F (37.2 C), resp. rate 18, height 5\' 4"  (1.626 m), weight 188 lb 8 oz (85.5 kg), last menstrual period 06/12/2017, SpO2 99 %, unknown if currently breastfeeding. Temp:  [97.5 F (36.4 C)-99 F (37.2 C)] 98.9 F (37.2 C) (03/24 1100) Pulse Rate:  [87-137] 107 (03/24 1100) Resp:  [12-26] 18 (03/24 1100) BP: (81-156)/(45-85) 106/71 (03/24 1100) SpO2:  [99 %-100 %] 99 % (03/24 1100)  Physical Exam:  General: alert, oriented, cooperative Chest: CTAB, normal respiratory effort Heart: RRR  Abdomen: +BS, soft, appropriately tender to palpation  Uterine Fundus: firm, 2 fingers below the umbilicus Lochia: moderate, rubra DVT Evaluation: no evidence of DVT Extremities: no edema, no calf tenderness   Current Facility-Administered Medications:  .  acetaminophen (TYLENOL) tablet 650 mg, 650 mg, Oral, Q4H PRN, Ellwood Denseumball, Alison, DO, 650 mg at 03/04/18 0328 .  benzocaine-Menthol (DERMOPLAST) 20-0.5 % topical spray 1 application, 1 application, Topical, PRN, Rumball, Alison, DO .  coconut oil, 1 application, Topical, PRN, Rumball, Alison, DO .  witch hazel-glycerin (TUCKS) pad 1 application, 1 application, Topical, PRN **AND** dibucaine (NUPERCAINAL) 1 % rectal ointment 1 application, 1 application, Rectal, PRN, Rumball, Alison, DO .  diphenhydrAMINE (BENADRYL) capsule 25 mg, 25 mg, Oral, Q6H PRN, Rumball, Alison, DO .  lactated ringers infusion, , Intravenous, Continuous, Reva BoresPratt, Tanya S, MD .  ondansetron (ZOFRAN) tablet 4 mg, 4 mg, Oral, Q4H PRN **OR** ondansetron (ZOFRAN)  injection 4 mg, 4 mg, Intravenous, Q4H PRN, Ellwood Denseumball, Alison, DO, 4 mg at 03/04/18 [id number] .  ondansetron (ZOFRAN) injection 4 mg, 4 mg, Intravenous, Once, Lewie LoronGermeroth, John, MD .  prenatal multivitamin tablet 1 tablet, 1 tablet, Oral, Q1200, Rumball, Alison, DO .  senna-docusate (Senokot-S) tablet 2 tablet, 2 tablet, Oral, Q24H, Rumball, Alison, DO, 2 tablet at 03/04/18 0024 .  simethicone (MYLICON) chewable tablet 80 mg, 80 mg, Oral, PRN, Ellwood Denseumball, Alison, DO .  Tdap (BOOSTRIX) injection 0.5 mL, 0.5 mL, Intramuscular, Once, Rumball, Alison, DO .  zolpidem (AMBIEN) tablet 5 mg, 5 mg, Oral, QHS PRN, Ellwood DenseRumball, Alison, DO Recent Labs    03/03/18 0749  HGB 11.6*  HCT 35.8*    Assessment/Plan: Plan for discharge tomorrow  Patient is 27 y.o. [id number] PPD#1 s/p SVD at 4573w0d. She is doing very well, recovering appropriately and complains only of mild pain. PPTL cancelled thsi am, patient [facility]   Continue routine post partum care Pain meds prn Regular diet IUD for birth control Plan for discharge tomorrow   Conan BowensKelly M Livingston Denner 03/04/2018, 11:50 AM

## 2018-03-05 ENCOUNTER — Encounter (HOSPITAL_COMMUNITY): Payer: Self-pay

## 2018-03-05 ENCOUNTER — Other Ambulatory Visit: Payer: Self-pay

## 2018-03-05 NOTE — Discharge Instructions (Signed)
Postpartum Care After Vaginal Delivery °The period of time right after you deliver your newborn is called the postpartum period. °What kind of medical care will I receive? °· You may continue to receive fluids and medicines through an IV tube inserted into one of your veins. °· If an incision was made near your vagina (episiotomy) or if you had some vaginal tearing during delivery, cold compresses may be placed on your episiotomy or your tear. This helps to reduce pain and swelling. °· You may be given a squirt bottle to use when you go to the bathroom. You may use this until you are comfortable wiping as usual. To use the squirt bottle, follow these steps: °? Before you urinate, fill the squirt bottle with warm water. Do not use hot water. °? After you urinate, while you are sitting on the toilet, use the squirt bottle to rinse the area around your urethra and vaginal opening. This rinses away any urine and blood. °? You may do this instead of wiping. As you start healing, you may use the squirt bottle before wiping yourself. Make sure to wipe gently. °? Fill the squirt bottle with clean water every time you use the bathroom. °· You will be given sanitary pads to wear. °How can I expect to feel? °· You may not feel the need to urinate for several hours after delivery. °· You will have some soreness and pain in your abdomen and vagina. °· If you are breastfeeding, you may have uterine contractions every time you breastfeed for up to several weeks postpartum. Uterine contractions help your uterus return to its normal size. °· It is normal to have vaginal bleeding (lochia) after delivery. The amount and appearance of lochia is often similar to a menstrual period in the first week after delivery. It will gradually decrease over the next few weeks to a dry, yellow-brown discharge. For most women, lochia stops completely by 6-8 weeks after delivery. Vaginal bleeding can vary from woman to woman. °· Within the first few  days after delivery, you may have breast engorgement. This is when your breasts feel heavy, full, and uncomfortable. Your breasts may also throb and feel hard, tightly stretched, warm, and tender. After this occurs, you may have milk leaking from your breasts. Your health care provider can help you relieve discomfort due to breast engorgement. Breast engorgement should go away within a few days. °· You may feel more sad or worried than normal due to hormonal changes after delivery. These feelings should not last more than a few days. If these feelings do not go away after several days, speak with your health care provider. °How should I care for myself? °· Tell your health care provider if you have pain or discomfort. °· Drink enough water to keep your urine clear or pale yellow. °· Wash your hands thoroughly with soap and water for at least 20 seconds after changing your sanitary pads, after using the toilet, and before holding or feeding your baby. °· If you are not breastfeeding, avoid touching your breasts a lot. Doing this can make your breasts produce more milk. °· If you become weak or lightheaded, or you feel like you might faint, ask for help before: °? Getting out of bed. °? Showering. °· Change your sanitary pads frequently. Watch for any changes in your flow, such as a sudden increase in volume, a change in color, the passing of large blood clots. If you pass a blood clot from your vagina, save it   to show to your health care provider. Do not flush blood clots down the toilet without having your health care provider look at them. °· Make sure that all your vaccinations are up to date. This can help protect you and your baby from getting certain diseases. You may need to have immunizations done before you leave the hospital. °· If desired, talk with your health care provider about methods of family planning or birth control (contraception). °How can I start bonding with my baby? °Spending as much time as  possible with your baby is very important. During this time, you and your baby can get to know each other and develop a bond. Having your baby stay with you in your room (rooming in) can give you time to get to know your baby. Rooming in can also help you become comfortable caring for your baby. Breastfeeding can also help you bond with your baby. °How can I plan for returning home with my baby? °· Make sure that you have a car seat installed in your vehicle. °? Your car seat should be checked by a certified car seat installer to make sure that it is installed safely. °? Make sure that your baby fits into the car seat safely. °· Ask your health care provider any questions you have about caring for yourself or your baby. Make sure that you are able to contact your health care provider with any questions after leaving the hospital. °This information is not intended to replace advice given to you by your health care provider. Make sure you discuss any questions you have with your health care provider. °Document Released: 09/25/2007 Document Revised: 05/02/2016 Document Reviewed: 11/02/2015 °[employer]vier Interactive Patient Education © 2018 [employer] ° °

## 2018-03-05 NOTE — Discharge Summary (Signed)
OB Discharge Summary     Patient Name: [name] DOB: [birth year] MRN: [id number]  Date of admission: 03/03/2018 Delivering MD: Ellwood Dense   Date of discharge: 03/05/2018  Admitting diagnosis: INDUCTION Intrauterine pregnancy: [redacted]w[redacted]d     Secondary diagnosis:  Principal Problem:   SVD (spontaneous vaginal delivery) Active Problems:   Supervision of other normal pregnancy, antepartum   History of pre-eclampsia in prior pregnancy, currently pregnant   Anxiety disorder affecting pregnancy, antepartum   Encounter for elective induction of labor      Discharge diagnosis: Term Pregnancy Delivered                                                                                                Post partum procedures:none  Augmentation: Pitocin  Complications: None  Hospital course:  Induction of Labor With Vaginal Delivery   27 y.o. yo [id number] at [redacted]w[redacted]d was admitted to the hospital 03/03/2018 for induction of labor.  Indication for induction: elective/social reasons..  Patient had an uncomplicated labor course with induction with IV Pitocin as follows: Membrane Rupture Time/Date: 12:20 PM ,03/03/2018   Intrapartum Procedures: Episiotomy: None [1]                                         Lacerations:  None [1]  Patient had delivery of a Viable infant.  Information for the patient's newborn:  Taylan, Mayhan [[id number]]  Delivery Method: Vaginal, Spontaneous(Filed from Delivery Summary)   03/03/2018  Details of delivery can be found in separate delivery note.  Patient had a routine postpartum course. She initially wanted postpartum tubal ligation, but on PPD#1 patient decided against BTL and would like IUD for birth control instead. Patient is discharged home 03/05/18.  Physical exam  Vitals:   03/04/18 1045 03/04/18 1100 03/04/18 1900 03/05/18 0547  BP: 102/67 106/71 118/80 120/76  Pulse: (!) 107 (!) 107 100 76  Resp: (!) 21 18 20 18   Temp:  98.9 F (37.2 C) 98.7 F  (37.1 C) 97.9 F (36.6 C)  TempSrc:  Oral Oral Oral  SpO2: 99% 99%    Weight:      Height:       General: alert, cooperative and no distress Lochia: appropriate Uterine Fundus: firm Incision: N/A DVT Evaluation: No evidence of DVT seen on physical exam. No significant calf/ankle edema. Labs: Lab Results  Component Value Date   WBC 16.9 (H) 03/03/2018   HGB 11.6 (L) 03/03/2018   HCT 35.8 (L) 03/03/2018   MCV 82.5 03/03/2018   PLT 283 03/03/2018   CMP Latest Ref Rng & Units 08/11/2017  Glucose 65 - 99 mg/dL 76  BUN 6 - 20 mg/dL 7  Creatinine 1.30 - 8.65 mg/dL 7.84  Sodium 696 - 295 mmol/L 138  Potassium 3.5 - 5.2 mmol/L 4.8  Chloride 96 - 106 mmol/L 102  CO2 20 - 29 mmol/L 23  Calcium 8.7 - 10.2 mg/dL 9.6  Total Protein 6.0 - 8.5 g/dL 6.9  Total Bilirubin 0.0 -  1.2 mg/dL 0.4  Alkaline Phos 39 - 117 IU/L 64  AST 0 - 40 IU/L 17  ALT 0 - 32 IU/L 13    Discharge instruction: per After Visit Summary and "Baby and Me Booklet".  After visit meds:  Allergies as of 03/05/2018      Reactions   Aspirin Hives   Ibuprofen Hives   Sulfa Antibiotics Hives      Medication List    TAKE these medications   butalbital-acetaminophen-caffeine 50-325-40 MG tablet Commonly known as:  FIORICET, ESGIC Take 1 tablet by mouth 2 (two) times daily as needed for headache.       Diet: routine diet  Activity: Advance as tolerated. Pelvic rest for 6 weeks.   Outpatient follow up: 4 weeks Follow up Appt: Future Appointments  Date Time Provider Department Center  04/11/2018 10:00 AM Federico FlakeNewton, Kimberly Niles, MD [facility]   Follow up Visit:No follow-ups on file.  Postpartum contraception: IUD Mirena  Newborn Data: Live born female  Birth Weight: 7 lb 6.2 oz (3350 g) APGAR: 7, 8  Newborn Delivery   Birth date/time:  03/03/2018 20:13:00 Delivery type:  Vaginal, Spontaneous     Baby Feeding: Bottle Disposition:home with mother   03/05/2018 Frederik PearJulie P Degele, MD

## 2018-03-06 ENCOUNTER — Encounter: Payer: Self-pay | Admitting: Obstetrics & Gynecology

## 2018-03-06 ENCOUNTER — Telehealth: Payer: Self-pay | Admitting: *Deleted

## 2018-03-06 MED ORDER — CYCLOBENZAPRINE HCL 10 MG PO TABS: 10.0000 mg | ORAL_TABLET | Freq: Three times a day (TID) | ORAL | 2 refills | Status: DC | PRN

## 2018-03-06 NOTE — Telephone Encounter (Signed)
LM for pt to rtn call - per Dr. Macon LargeAnyanwu we can send in Flexeril and she can take OTC tylenol

## 2018-03-06 NOTE — Telephone Encounter (Signed)
-----   Message from Lindell SparHeather L Bacon, [place] sent at 03/06/2018  8:16 AM EDT ----- Regarding: patient requesting something for pain Contact: [phone] Please call patient she is requesting something for pain, delivered 03/03/18

## 2018-03-06 NOTE — Addendum Note (Signed)
Addended by: Arne ClevelandHUTCHINSON, MANDY J on: 03/06/2018 03:09 PM   Modules accepted: Orders

## 2018-03-12 ENCOUNTER — Encounter (HOSPITAL_COMMUNITY): Payer: Self-pay | Admitting: Obstetrics and Gynecology

## 2018-04-11 ENCOUNTER — Ambulatory Visit (INDEPENDENT_AMBULATORY_CARE_PROVIDER_SITE_OTHER): Payer: Medicaid Other | Admitting: Family Medicine

## 2018-04-11 ENCOUNTER — Encounter: Payer: Self-pay | Admitting: Family Medicine

## 2018-04-11 DIAGNOSIS — Z1389 Encounter for screening for other disorder: Secondary | ICD-10-CM

## 2018-04-11 DIAGNOSIS — Z975 Presence of (intrauterine) contraceptive device: Secondary | ICD-10-CM | POA: Insufficient documentation

## 2018-04-11 DIAGNOSIS — Z3202 Encounter for pregnancy test, result negative: Secondary | ICD-10-CM

## 2018-04-11 DIAGNOSIS — F419 Anxiety disorder, unspecified: Secondary | ICD-10-CM

## 2018-04-11 DIAGNOSIS — Z30430 Encounter for insertion of intrauterine contraceptive device: Secondary | ICD-10-CM

## 2018-04-11 DIAGNOSIS — O09299 Supervision of pregnancy with other poor reproductive or obstetric history, unspecified trimester: Secondary | ICD-10-CM

## 2018-04-11 DIAGNOSIS — Z3480 Encounter for supervision of other normal pregnancy, unspecified trimester: Secondary | ICD-10-CM

## 2018-04-11 DIAGNOSIS — O99340 Other mental disorders complicating pregnancy, unspecified trimester: Secondary | ICD-10-CM

## 2018-04-11 LAB — POCT URINE PREGNANCY: PREG TEST UR: NEGATIVE

## 2018-04-11 MED ORDER — LEVONORGESTREL 19.5 MCG/DAY IU IUD
INTRAUTERINE_SYSTEM | Freq: Once | INTRAUTERINE | Status: AC
  Administered 2018-04-11: 11:00:00 via INTRAUTERINE

## 2018-04-11 NOTE — Progress Notes (Signed)
Post Partum Exam  [name] is a 27 y.o. [phone] female who presents for a postpartum visit. She is 5 weeks postpartum following a spontaneous vaginal delivery. I have fully reviewed the prenatal and intrapartum course. The delivery was at 39.0 gestational weeks.  Anesthesia: epidural. Postpartum course has been unremarkable. Baby's course has been unremarkable. Baby is feeding by bottle - Gerber Gentle. Bleeding started cycle yesterday. Bowel function is normal. Bladder function is normal. Patient is sexually active. Contraception method is IUD. Postpartum depression screening:neg  Edinburgh Postnatal Depression Scale - 04/11/18 1019      Edinburgh Postnatal Depression Scale:  In the Past 7 Days   I have been able to laugh and see the funny side of things.  0    I have looked forward with enjoyment to things.  0    I have blamed myself unnecessarily when things went wrong.  2    I have been anxious or worried for no good reason.  0    I have felt scared or panicky for no good reason.  2    Things have been getting on top of me.  0    I have been so unhappy that I have had difficulty sleeping.  1    I have felt sad or miserable.  1    I have been so unhappy that I have been crying.  0    The thought of harming myself has occurred to me.  0    Edinburgh Postnatal Depression Scale Total  6       The following portions of the patient's history were reviewed and updated as appropriate: allergies, current medications, past family history, past medical history, past social history, past surgical history and problem list. Last pap smear done August 2018 and was Normal  Review of Systems Pertinent items are noted in HPI.    Objective:  Blood pressure 106/72, pulse (!) 106, height  (1.676 m), weight 158 lb (71.7 kg), last menstrual period 04/10/2018, not currently breastfeeding.  General:  alert, cooperative and appears stated age   Breasts:  inspection negative, no nipple discharge  or bleeding, no masses or nodularity palpable  Lungs: clear to auscultation bilaterally  Heart:  regular rate and rhythm, S1, S2 normal, no murmur, click, rub or gallop  Abdomen: soft, non-tender; bowel sounds normal; no masses,  no organomegaly   Vulva:  normal  Vagina: normal vagina  Cervix:  multiparous appearance  Corpus: normal  Adnexa:  normal adnexa  Rectal Exam: Not performed.    IUD Insertion Procedure Note Patient identified, informed consent performed, consent signed.   Discussed risks of irregular bleeding, cramping, infection, malpositioning or misplacement of the IUD outside the uterus which may require further procedure such as laparoscopy. Time out was performed.  Urine pregnancy test negative.  Speculum placed in the vagina.  Cervix visualized.  Cleaned with Betadine x 2.  Uterus sounded to 8 cm and tenaculum was not used.  Mirena IUD placed per manufacturer's recommendations.  Strings trimmed to 3 cm. Patient tolerated procedure well.   Patient was given post-procedure instructions.  She was advised to have backup contraception for one week.  Patient was also asked to check IUD strings periodically and follow up in 4 weeks for IUD check.  Assessment:   Normal postpartum exam. Pap smear not done at today's visit.   Plan:   1. Contraception: IUD- placed today. Recommended 1-2 weeksof condom use.  2. Mood- low risk but elevated,  reviewed resources. Does not desire referral at this point. Reviewed [facility] as resource if sx worsen 3. Chronic Medical conditions- h/o depression and anxiety. See above   Follow up in: 4 weeks or as needed.

## 2018-04-19 ENCOUNTER — Telehealth: Payer: Self-pay

## 2018-04-19 NOTE — Telephone Encounter (Signed)
Received message from on call nurse concerning patient stating she is having some chest pain and breast pain that started a couple days ago. I called patient and advised her to go the hospital to seek treatment. Patient stated she can not afford the gas to go the ER right and her life is a mess. I advised patient to call 911 and they would come out to pick her up. Patient stated she would think about it and would call us back if she needs [place]. I have also advised patient that she will need to find a pcp so that they can manage her primary care concerns since she has now delivered. Patient reports she has one and they are located in [place] which is too far for her to drive right now.

## 2018-04-26 ENCOUNTER — Inpatient Hospital Stay (HOSPITAL_COMMUNITY)
Admission: AD | Admit: 2018-04-26 | Discharge: 2018-04-26 | Disposition: A | Discharge status: Home / Self Care | Payer: Medicaid Other | Source: Ambulatory Visit | Attending: Obstetrics and Gynecology | Admitting: Obstetrics and Gynecology

## 2018-04-26 ENCOUNTER — Encounter (HOSPITAL_COMMUNITY): Payer: Self-pay | Admitting: *Deleted

## 2018-04-26 ENCOUNTER — Inpatient Hospital Stay (HOSPITAL_COMMUNITY): Payer: Medicaid Other

## 2018-04-26 DIAGNOSIS — F419 Anxiety disorder, unspecified: Secondary | ICD-10-CM | POA: Diagnosis not present

## 2018-04-26 DIAGNOSIS — Z975 Presence of (intrauterine) contraceptive device: Secondary | ICD-10-CM | POA: Diagnosis not present

## 2018-04-26 DIAGNOSIS — R102 Pelvic and perineal pain: Secondary | ICD-10-CM

## 2018-04-26 DIAGNOSIS — Z87891 Personal history of nicotine dependence: Secondary | ICD-10-CM | POA: Diagnosis not present

## 2018-04-26 DIAGNOSIS — F319 Bipolar disorder, unspecified: Secondary | ICD-10-CM | POA: Insufficient documentation

## 2018-04-26 DIAGNOSIS — Z8249 Family history of ischemic heart disease and other diseases of the circulatory system: Secondary | ICD-10-CM | POA: Insufficient documentation

## 2018-04-26 DIAGNOSIS — J45909 Unspecified asthma, uncomplicated: Secondary | ICD-10-CM | POA: Insufficient documentation

## 2018-04-26 DIAGNOSIS — X58XXXA Exposure to other specified factors, initial encounter: Secondary | ICD-10-CM | POA: Diagnosis not present

## 2018-04-26 DIAGNOSIS — T8332XA Displacement of intrauterine contraceptive device, initial encounter: Secondary | ICD-10-CM | POA: Diagnosis not present

## 2018-04-26 HISTORY — DX: Major depressive disorder, single episode, unspecified: F32.9

## 2018-04-26 HISTORY — DX: Unspecified infectious disease: B99.9

## 2018-04-26 HISTORY — DX: Depression, unspecified: F32.A

## 2018-04-26 LAB — URINALYSIS, ROUTINE W REFLEX MICROSCOPIC
BILIRUBIN URINE: NEGATIVE
Bacteria, UA: NONE SEEN
GLUCOSE, UA: NEGATIVE mg/dL
Ketones, ur: NEGATIVE mg/dL
NITRITE: NEGATIVE
PROTEIN: NEGATIVE mg/dL
Specific Gravity, Urine: 1.009 (ref 1.005–1.030)
pH: 5 (ref 5.0–8.0)

## 2018-04-26 LAB — WET PREP, GENITAL
CLUE CELLS WET PREP: NONE SEEN
Sperm: NONE SEEN
Trich, Wet Prep: NONE SEEN
Yeast Wet Prep HPF POC: NONE SEEN

## 2018-04-26 LAB — POCT PREGNANCY, URINE: Preg Test, Ur: NEGATIVE

## 2018-04-26 NOTE — MAU Provider Note (Signed)
History     CSN: [id number]  Arrival date and time: 04/26/18 1044   First Provider Initiated Contact with Patient 04/26/18 1129      Chief Complaint  Patient presents with  . Pelvic Pain   27 y.o. Non-pregnant female here with "cervical pain". Pain started this am. Feels like a strong cramp at cervix. Did not take anything for it. Has had uncomfortable IC since IUD placed. No bleeding or discharge. Tried to feel for strings but couldn't.   OB History    Gravida  4   Para  4   Term  4   Preterm      AB      Living  4     SAB      TAB      Ectopic      Multiple  0   Live Births  4           Past Medical History:  Diagnosis Date  . Anxiety   . Asthma   . Bipolar 1 disorder (HCC)   . Depression    doing  . History of pre-eclampsia   . Infection    UTI  . Pregnancy induced hypertension     Past Surgical History:  Procedure Laterality Date  . NO PAST SURGERIES     tubal was not done per pt  . TUBAL LIGATION N/A 03/04/2018   Procedure: POST PARTUM TUBAL LIGATION;  Surgeon: Conan Bowens, MD;  [facility];  Service: Obstetrics;  Laterality: N/A;  . WISDOM TOOTH EXTRACTION      Family History  Problem Relation Age of Onset  . Hypertension Mother   . Hypertension Father   . Angina Father     Social History   Tobacco Use  . Smoking status: Former Smoker    Packs/day: 2.00    Types: Cigarettes    Last attempt to quit: 09/14/2016    Years since quitting: 1.6  . Smokeless tobacco: Never Used  Substance Use Topics  . Alcohol use: No  . Drug use: No    Allergies:  Allergies  Allergen Reactions  . Aspirin Hives  . Ibuprofen Hives  . Sulfa Antibiotics Hives    Medications Prior to Admission  Medication Sig Dispense Refill Last Dose  . butalbital-acetaminophen-caffeine (FIORICET, ESGIC) 50-325-40 MG tablet Take 1 tablet by mouth 2 (two) times daily as needed for headache.   Past Week at Unknown time  . cyclobenzaprine  (FLEXERIL) 10 MG tablet Take 1 tablet (10 mg total) by mouth 3 (three) times daily as needed for muscle spasms. 30 tablet 2     Review of Systems  Gastrointestinal: Negative for abdominal pain.  Genitourinary: Positive for pelvic pain. Negative for vaginal bleeding and vaginal discharge.   Physical Exam   Blood pressure 124/74, pulse (!) 108, temperature 98.9 F (37.2 C), resp. rate 16, height  (1.676 m), weight 180 lb (81.6 kg), last menstrual period 04/10/2018, not currently breastfeeding.  Physical Exam  Nursing note and vitals reviewed. Constitutional: She is oriented to person, place, and time. She appears well-developed and well-nourished.  HENT:  Head: Normocephalic and atraumatic.  Respiratory: Effort normal. No respiratory distress.  Genitourinary:  Genitourinary Comments: External: no lesions or erythema Vagina: rugated, pink, moist, thin white discharge Uterus: non enlarged, anteverted, non tender, no CMT Adnexae: no masses, no tenderness left, no tenderness right Cervix no strings seen   Musculoskeletal: Normal range of motion.  Neurological: She is alert and  oriented to person, place, and time.  Skin: Skin is warm and dry.  Psychiatric: She has a normal mood and affect.   Results for orders placed or performed during the hospital encounter of 04/26/18 (from the past 24 hour(s))  Urinalysis, Routine w reflex microscopic     Status: Abnormal   Collection Time: 04/26/18 11:10 AM  Result Value Ref Range   Color, Urine YELLOW YELLOW   APPearance CLEAR CLEAR   Specific Gravity, Urine 1.009 1.005 - 1.030   pH 5.0 5.0 - 8.0   Glucose, UA NEGATIVE NEGATIVE mg/dL   Hgb urine dipstick MODERATE (A) NEGATIVE   Bilirubin Urine NEGATIVE NEGATIVE   Ketones, ur NEGATIVE NEGATIVE mg/dL   Protein, ur NEGATIVE NEGATIVE mg/dL   Nitrite NEGATIVE NEGATIVE   Leukocytes, UA SMALL (A) NEGATIVE   RBC / HPF 0-5 0 - 5 RBC/hpf   WBC, UA 0-5 0 - 5 WBC/hpf   Bacteria, UA NONE SEEN  NONE SEEN   Squamous Epithelial / LPF 6-10 0 - 5   Mucus PRESENT   Pregnancy, urine POC     Status: None   Collection Time: 04/26/18 11:22 AM  Result Value Ref Range   Preg Test, Ur NEGATIVE NEGATIVE  US Pelvic Complete With Transvaginal  Result Date: 04/26/2018 CLINICAL DATA:  Pelvic pain, lost IUD strings EXAM: TRANSABDOMINAL AND TRANSVAGINAL ULTRASOUND OF PELVIS TECHNIQUE: Both transabdominal and transvaginal ultrasound examinations of the pelvis were performed. Transabdominal technique was performed for global imaging of the pelvis including uterus, ovaries, adnexal regions, and pelvic cul-de-sac. It was necessary to proceed with endovaginal exam following the transabdominal exam to visualize the IUD and endometrium. COMPARISON:  None FINDINGS: Uterus Measurements: 10.2 x 5.6 x 7.2 cm.  Normal morphology without mass Endometrium Thickness: 7 mm thick IUD located at upper uterine segment in roughly expected position. No endometrial fluid. Right ovary Measurements: 4.2 x 2.6 x 2.9 cm.  Normal morphology without mass Left ovary Measurements: 4.5 x 1.4 x 2.3 cm.  Normal morphology without mass Other findings Trace free pelvic fluid.  No adnexal masses. IMPRESSION: IUD located at upper uterine segment in roughly expected position. Otherwise unremarkable uterus and ovaries. Electronically Signed   By: Ulyses Southward M.D.   On: 04/26/2018 13:33   MAU Course  Procedures  MDM Declines pain meds. Labs and [place] ordered and reviewed. IUD in place. No evidence of acute pelvic process. Cultures pending. No UTI. Stable for discharge.  Assessment and Plan   1. IUD (intrauterine device) in place   2. Pelvic pain   3. IUD strings lost    Discharge home Follow up in OBGYN office as needed Tylenol prn  Allergies as of 04/26/2018      Reactions   Aspirin Hives   Ibuprofen Hives   Sulfa Antibiotics Hives      Medication List    TAKE these medications   butalbital-acetaminophen-caffeine 50-325-40 MG  tablet Commonly known as:  FIORICET, ESGIC Take 1 tablet by mouth 2 (two) times daily as needed for headache.   cyclobenzaprine 10 MG tablet Commonly known as:  FLEXERIL Take 1 tablet (10 mg total) by mouth 3 (three) times daily as needed for muscle spasms.      Donette Larry, CNM 04/26/2018, 2:09 PM

## 2018-04-26 NOTE — Discharge Instructions (Signed)
Pelvic Pain, Female °Pelvic pain is pain in your lower belly (abdomen), below your belly button and between your hips. The pain may start suddenly (acute), keep coming back (recurring), or last a long time (chronic). Pelvic pain that lasts longer than six months is considered chronic. There are many causes of pelvic pain. Sometimes the cause of your pelvic pain is not known. °Follow these instructions at home: °· Take over-the-counter and prescription medicines only as told by your doctor. °· Rest as told by your doctor. °· Do not have sex it if hurts. °· Keep a journal of your pelvic pain. Write down: °? When the pain started. °? Where the pain is located. °? What seems to make the pain better or worse, such as food or your menstrual cycle. °? Any symptoms you have along with the pain. °· Keep all follow-up visits as told by your doctor. This is important. °Contact a doctor if: °· Medicine does not help your pain. °· Your pain comes back. °· You have new symptoms. °· You have unusual vaginal discharge or bleeding. °· You have a fever or chills. °· You are having a hard time pooping (constipation). °· You have blood in your pee (urine) or poop (stool). °· Your pee smells bad. °· You feel weak or lightheaded. °Get help right away if: °· You have sudden pain that is very bad. °· Your pain continues to get worse. °· You have very bad pain and also have any of the following symptoms: °? A fever. °? Feeling stick to your stomach (nausea). °? Throwing up (vomiting). °? Being very sweaty. °· You pass out (lose consciousness). °This information is not intended to replace advice given to you by your health care provider. Make sure you discuss any questions you have with your health care provider. °Document Released: 05/16/2008 Document Revised: 12/23/2015 Document Reviewed: 09/18/2015 °Elsevier Interactive Patient Education © 2018 [employer] Inc. ° °

## 2018-04-26 NOTE — MAU Note (Signed)
Pt stated she had IUD placed on May 1, . Had intercourse last night   Started having pain today. Stated she could not feel strings this morning when she checked.

## 2018-04-27 LAB — GC/CHLAMYDIA PROBE AMP (~~LOC~~) NOT AT ARMC
Chlamydia: NEGATIVE
NEISSERIA GONORRHEA: NEGATIVE

## 2018-05-09 ENCOUNTER — Encounter: Payer: Self-pay | Admitting: Family Medicine

## 2018-05-09 ENCOUNTER — Ambulatory Visit (INDEPENDENT_AMBULATORY_CARE_PROVIDER_SITE_OTHER): Payer: Medicaid Other | Admitting: Family Medicine

## 2018-05-09 ENCOUNTER — Ambulatory Visit: Payer: Self-pay | Admitting: Family Medicine

## 2018-05-09 VITALS — BP 120/72 | HR 72 | Wt 180.5 lb

## 2018-05-09 DIAGNOSIS — Z30431 Encounter for routine checking of intrauterine contraceptive device: Secondary | ICD-10-CM

## 2018-05-09 DIAGNOSIS — R4586 Emotional lability: Secondary | ICD-10-CM | POA: Diagnosis not present

## 2018-05-09 DIAGNOSIS — Z975 Presence of (intrauterine) contraceptive device: Secondary | ICD-10-CM

## 2018-05-09 NOTE — Progress Notes (Signed)
   GYNECOLOGY CLINIC- IUD STRING CHECK PROGRESS NOTE  History:  27 y.o. [id number] here today for today for IUD string check; Liletta IUD was placed  3/21-- recently sean at hospital and device confirmed to be in place. Patient is very anxious about not feeling strings. She reports significant mood changes after delivery. Reports she is "Google-ing" everything. Reports similar sx after delivery of first child. Has PCP appt on Thursday to discuss mood sx.    The following portions of the patient's history were reviewed and updated as appropriate: allergies, current medications, past family history, past medical history, past social history, past surgical history and problem list.   Review of Systems:  Pertinent items are noted in HPI.   Objective:  Physical Exam Blood pressure 120/72, pulse 72, weight 180 lb 8 oz (81.9 kg), last menstrual period 04/10/2018, not currently breastfeeding. Gen: NAD Abd: Soft, nontender and nondistended Pelvic: deferred-- patient does not desire string check after discuss about [place] and reassurance    [facility] Total Score  2       Assessment & Plan:  #IUD in place:  Normal IUD check. Patient to keep IUD in place for five years; can come in for removal if she desires pregnancy within the next five years. Routine preventative health maintenance measures emphasized  #Mood - High suspicous for postpartum anxiety/depression - Recommended discussing with PCP starting medication and counseling - PHQ9 is low risk today but pt seems to have significant dysfunction 2/2 to anxiety.   Federico Flake, MD  Faculty Practice  [facility] for [employer], [facility]

## 2019-02-19 ENCOUNTER — Encounter: Payer: Self-pay | Admitting: Radiology

## 2019-02-26 ENCOUNTER — Ambulatory Visit: Payer: Self-pay | Admitting: Family Medicine

## 2019-02-26 ENCOUNTER — Encounter: Payer: Self-pay | Admitting: Family Medicine

## 2019-02-26 NOTE — Progress Notes (Signed)
Patient did not keep appointment today. She may call to reschedule.  

## 2019-05-07 IMAGING — CR DG CHEST 2V
2 series · 2 of 2 positions shown · non-contrast
Comparison: 09/25/2016

CLINICAL DATA: Onset of LEFT-sided chest pain 3 days ago, getting
worse, cough

EXAM:
CHEST  2 VIEW

[chest pa]
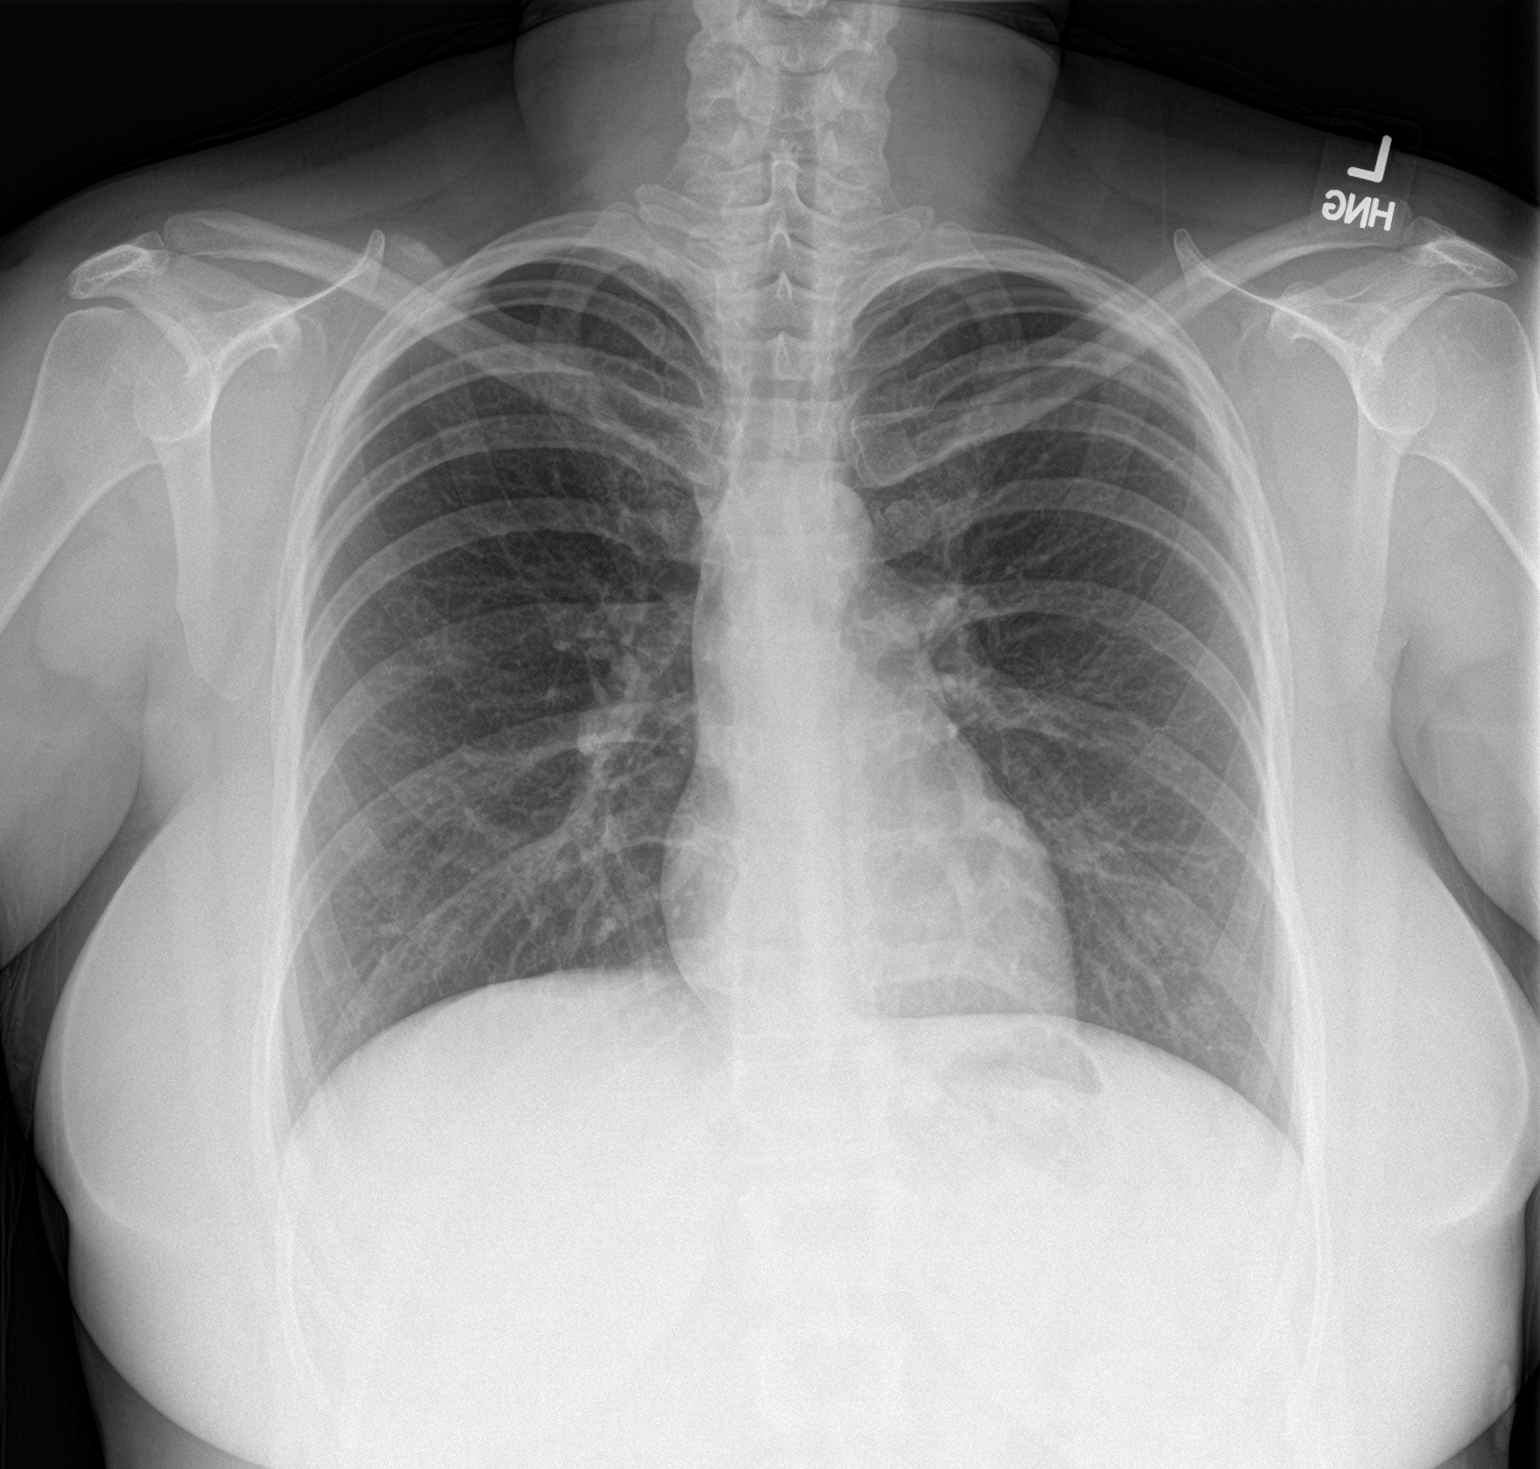

[chest lat]
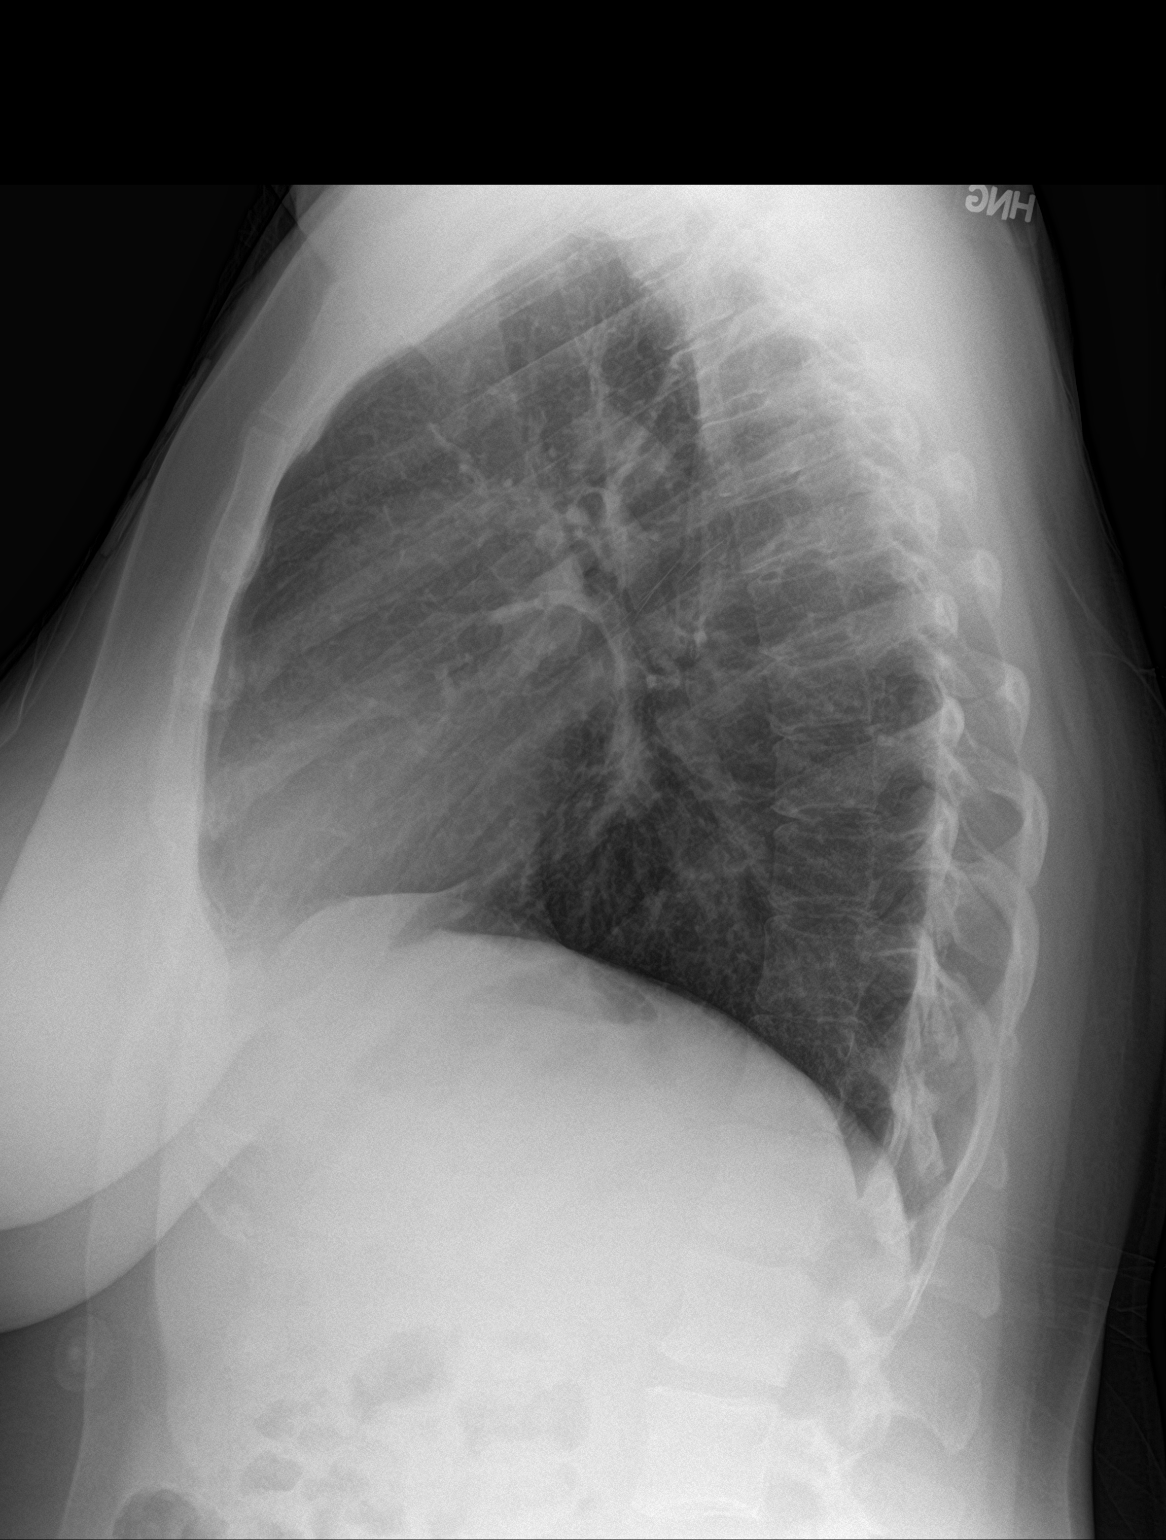

[2 of 2 positions shown; findings below may reference images not displayed]

FINDINGS: Normal heart size, mediastinal contours, and pulmonary vascularity.

Lungs clear.

No pleural effusion or pneumothorax.

Bones unremarkable.
IMPRESSION: No acute abnormalities.

## 2019-08-14 IMAGING — US US MFM OB LIMITED
1 series · 14 of 28 positions shown · non-contrast
Comparison: none

[Series 1: us mfm ob limited · 14 of 50 slices shown]
[im 2/50]
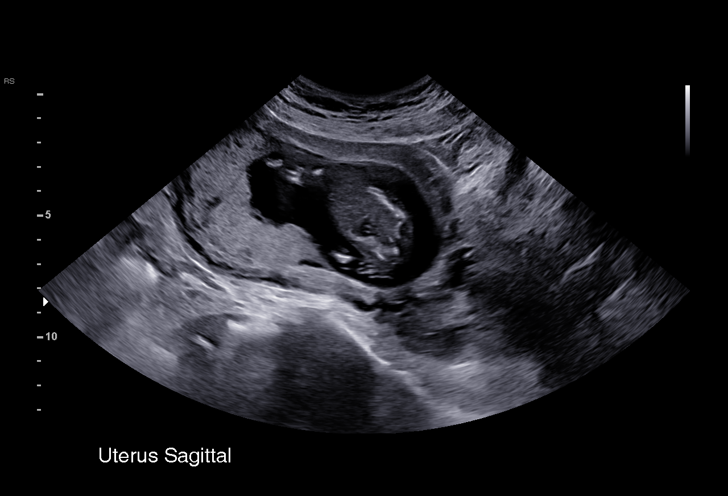
[im 6/50]
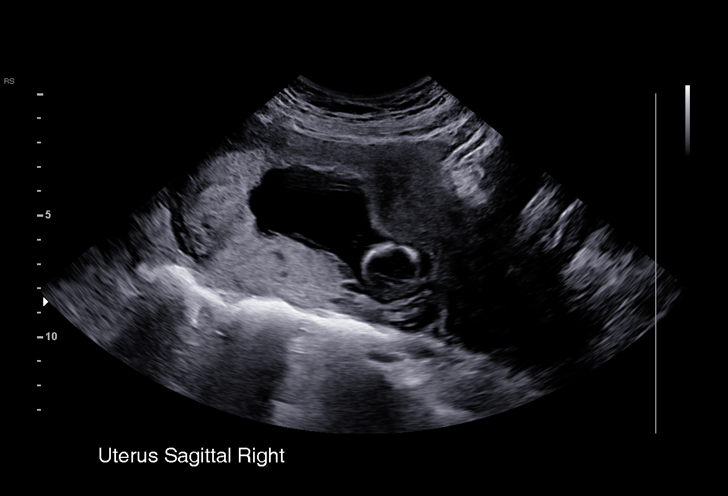
[im 10/50]
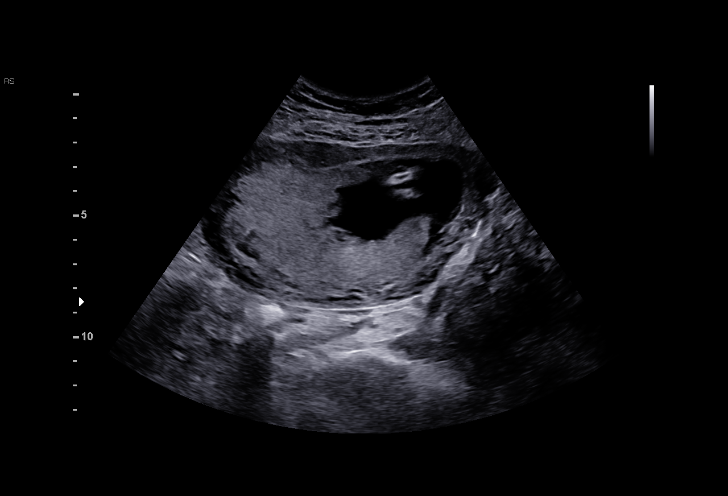
[im 13/50]
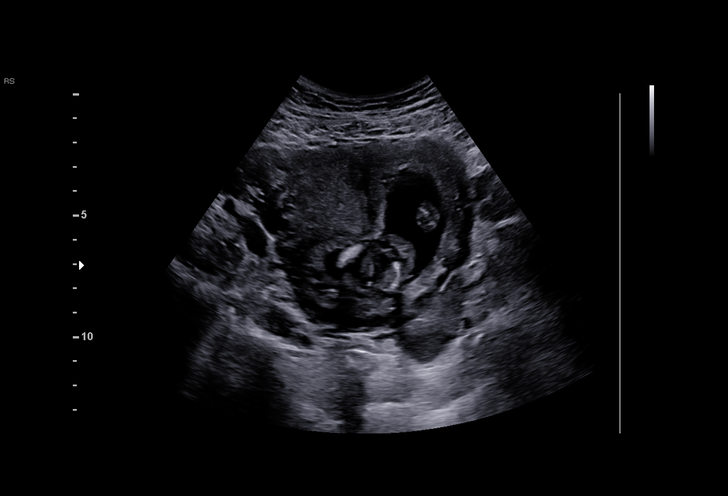
[im 17/50]
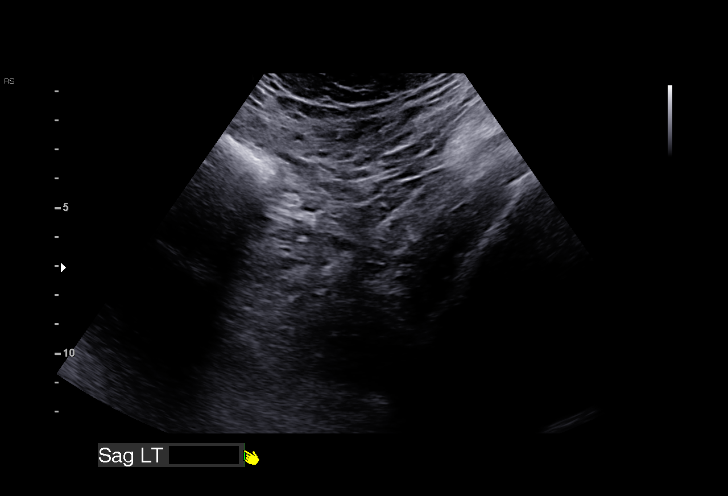
[im 20/50]
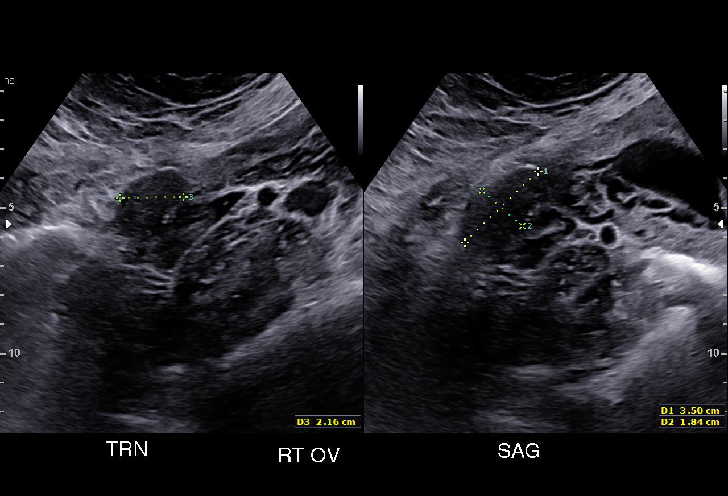
[im 24/50]
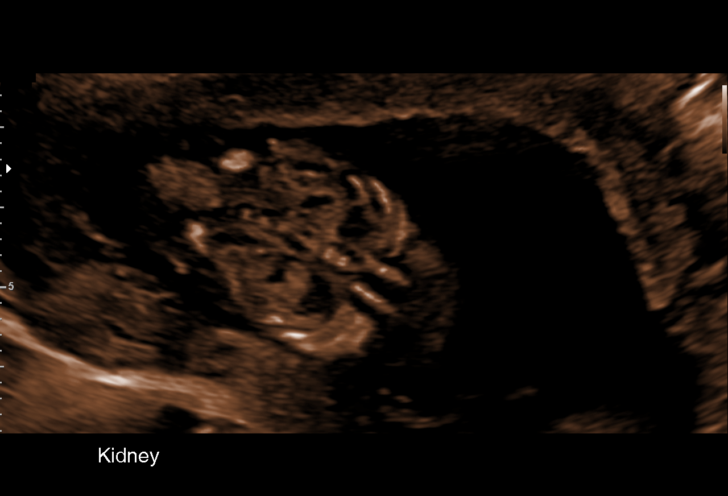
[im 28/50]
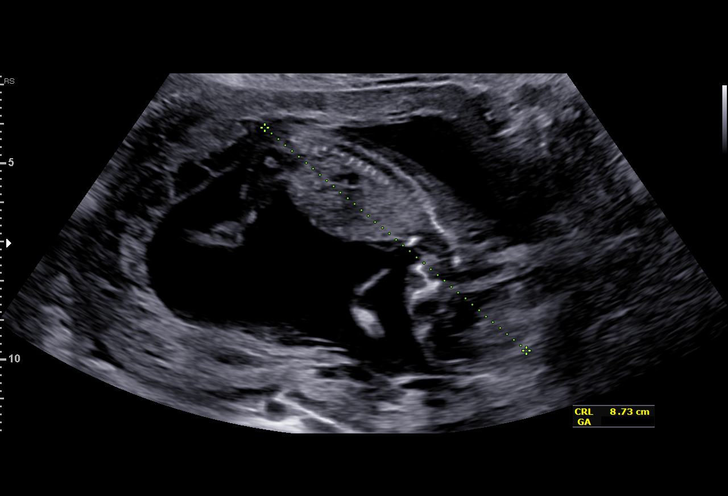
[im 31/50]
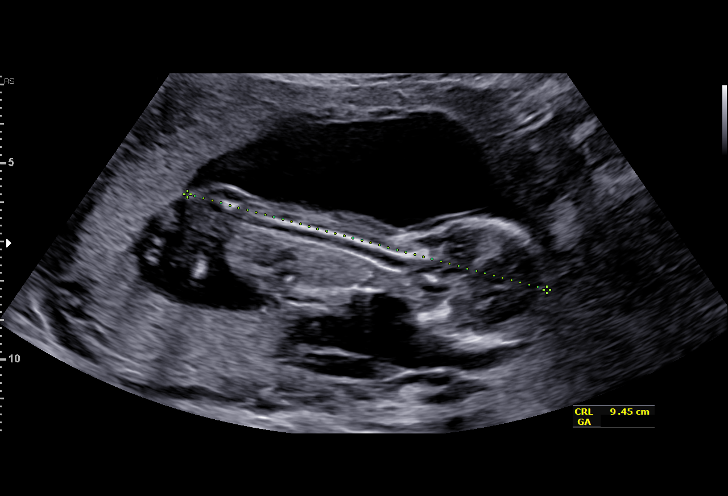
[im 35/50]
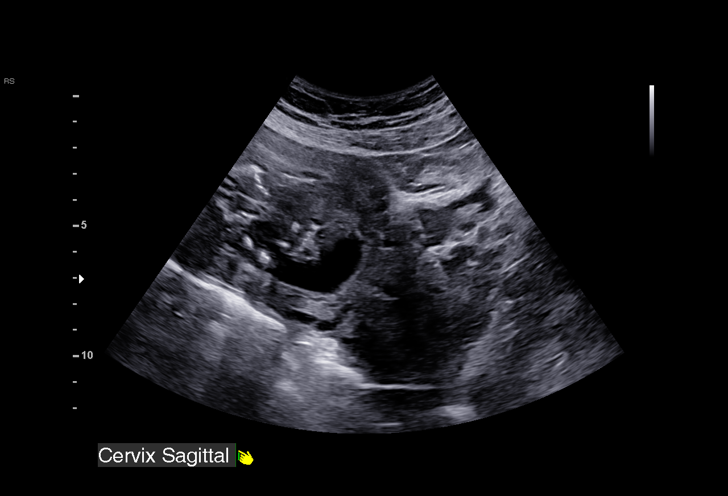
[im 39/50]
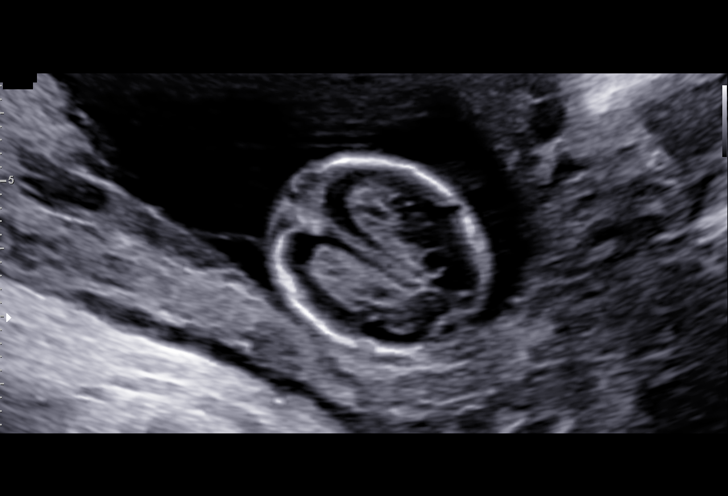
[im 42/50]
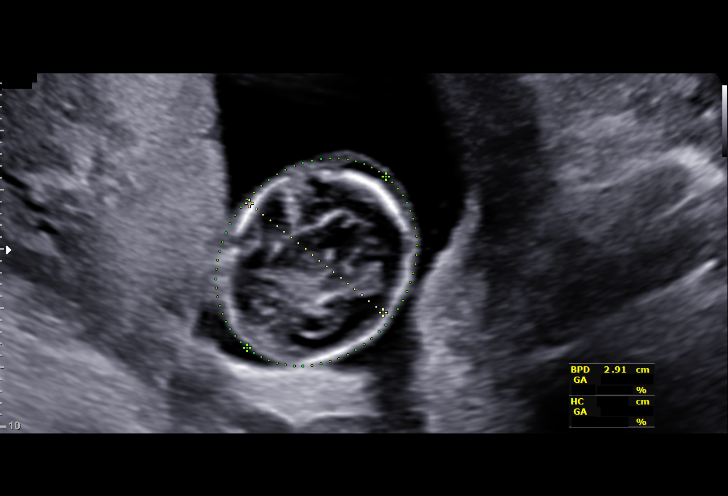
[im 46/50]
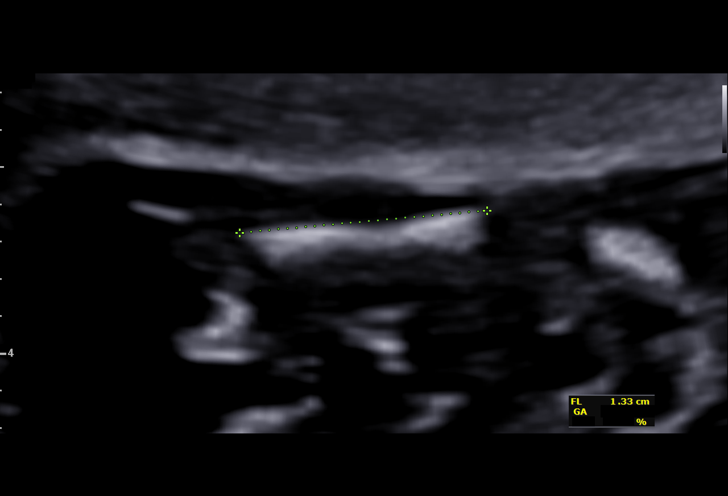
[im 50/50]
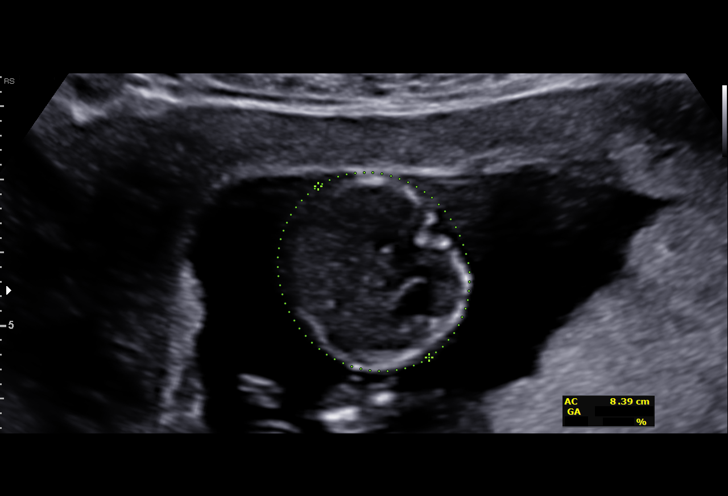

[14 of 28 positions shown; findings below may reference images not displayed]

[REDACTED]

1  JHEMBOY PADAM           [id number]      [id number]     [id number]
Indications

14 weeks gestation of pregnancy
Encounter for uncertain dates
OB History

Gravidity:    4         Term:   3
Living:       3
Fetal Evaluation

Num Of Fetuses:     1
Fetal Heart         161
Rate(bpm):
Cardiac Activity:   Observed
Presentation:       Variable
Placenta:           Fundal

Amniotic Fluid
AFI FV:      Subjectively within normal limits

Largest Pocket(cm)
3.62
Biometry

CRL:      93.1  mm     G. Age:  N/A                     EDD:
BPD:      29.3  mm     G. Age:  15w 2d                  CI:        73.72   %    70 - 86
FL/HC:      11.5   %    15.3 -
HC:      108.4  mm     G. Age:  15w 1d                  HC/AC:      1.29        1.05 -
AC:       83.8  mm     G. Age:  14w 5d                  FL/BPD:     42.7   %
FL:       12.5  mm     G. Age:  13w 5d                  FL/AC:      14.9   %    20 - 24
HUM:      15.1  mm     G. Age:  14w 1d

Est. FW:      95  gm      0 lb 3 oz
Gestational Age

LMP:           13w 3d        Date:  06/12/17                 EDD:   03/19/18
U/S Today:     14w 5d                                        EDD:   03/10/18
Best:          14w 5d     Det. By:  U/S (09/14/17)           EDD:   03/10/18
Anatomy

Cranium:               Appears normal         Bladder:                Appears normal
Choroid Plexus:        Heterogeneous          Upper Extremities:      Visualized
Stomach:               Appears normal         Lower Extremities:      Visualized
Kidneys:               Appear normal
Cervix Uterus Adnexa

Cervix
Normal appearance by transabdominal scan.

Uterus
No abnormality visualized.

Left Ovary
Not visualized.

Right Ovary
Within normal limits.

Adnexa:       No abnormality visualized. No adnexal mass
visualized.
Impression

SIUP at 14+5 weeks
No gross abnormalities identified
CRL > 84 mms, therefore no NT measurement was obtained
Normal amniotic fluid volume
EDC based on today's measurements: 03/10/18
Recommendations

Follow-up ultrasound in 3-4 weeks for detailed anatomic
survey
Offer quad screen

## 2020-04-28 ENCOUNTER — Ambulatory Visit: Payer: Medicaid Other | Admitting: Obstetrics & Gynecology

## 2020-05-26 ENCOUNTER — Ambulatory Visit: Payer: Medicaid Other | Admitting: Obstetrics & Gynecology

## 2020-06-25 ENCOUNTER — Ambulatory Visit: Payer: Medicaid Other | Admitting: Obstetrics & Gynecology

## 2020-08-22 DIAGNOSIS — M79622 Pain in left upper arm: Secondary | ICD-10-CM | POA: Diagnosis not present

## 2020-08-24 DIAGNOSIS — F439 Reaction to severe stress, unspecified: Secondary | ICD-10-CM | POA: Diagnosis not present

## 2020-08-24 DIAGNOSIS — F329 Major depressive disorder, single episode, unspecified: Secondary | ICD-10-CM | POA: Diagnosis not present

## 2020-08-24 DIAGNOSIS — F419 Anxiety disorder, unspecified: Secondary | ICD-10-CM | POA: Diagnosis not present

## 2020-09-16 DIAGNOSIS — F419 Anxiety disorder, unspecified: Secondary | ICD-10-CM | POA: Diagnosis not present

## 2020-09-16 DIAGNOSIS — F4310 Post-traumatic stress disorder, unspecified: Secondary | ICD-10-CM | POA: Diagnosis not present

## 2020-11-23 DIAGNOSIS — F419 Anxiety disorder, unspecified: Secondary | ICD-10-CM | POA: Diagnosis not present

## 2020-11-23 DIAGNOSIS — F4310 Post-traumatic stress disorder, unspecified: Secondary | ICD-10-CM | POA: Diagnosis not present

## 2021-01-21 ENCOUNTER — Ambulatory Visit: Payer: Medicaid Other | Admitting: Obstetrics and Gynecology

## 2021-01-28 DIAGNOSIS — F4310 Post-traumatic stress disorder, unspecified: Secondary | ICD-10-CM | POA: Diagnosis not present

## 2021-02-16 ENCOUNTER — Encounter: Payer: Self-pay | Admitting: Obstetrics and Gynecology

## 2021-02-16 ENCOUNTER — Ambulatory Visit (INDEPENDENT_AMBULATORY_CARE_PROVIDER_SITE_OTHER): Payer: Medicaid Other | Admitting: Obstetrics and Gynecology

## 2021-02-16 ENCOUNTER — Other Ambulatory Visit (HOSPITAL_COMMUNITY)
Admission: RE | Admit: 2021-02-16 | Discharge: 2021-02-16 | Disposition: A | Discharge status: Home / Self Care | Payer: Medicaid Other | Source: Ambulatory Visit | Attending: Obstetrics and Gynecology | Admitting: Obstetrics and Gynecology

## 2021-02-16 ENCOUNTER — Other Ambulatory Visit: Payer: Self-pay

## 2021-02-16 VITALS — BP 122/86 | HR 96 | Wt 202.0 lb

## 2021-02-16 DIAGNOSIS — Z124 Encounter for screening for malignant neoplasm of cervix: Secondary | ICD-10-CM | POA: Insufficient documentation

## 2021-02-16 DIAGNOSIS — R87612 Low grade squamous intraepithelial lesion on cytologic smear of cervix (LGSIL): Secondary | ICD-10-CM | POA: Diagnosis not present

## 2021-02-16 DIAGNOSIS — Z30011 Encounter for initial prescription of contraceptive pills: Secondary | ICD-10-CM

## 2021-02-16 HISTORY — PX: IUD REMOVAL: OBO 1004

## 2021-02-16 MED ORDER — LEVONORGESTREL 1.5 MG PO TABS: 1.5000 mg | ORAL_TABLET | Freq: Once | ORAL | 0 refills | Status: AC

## 2021-02-16 MED ORDER — LO LOESTRIN FE 1 MG-10 MCG / 10 MCG PO TABS: 1.0000 | ORAL_TABLET | Freq: Every day | ORAL | 4 refills | Status: DC

## 2021-02-16 NOTE — Progress Notes (Signed)
Last pap WNL- done with provider in [place]

## 2021-02-16 NOTE — Procedures (Addendum)
Intrauterine Device (IUD) Removal Procedure Note  Patient would like IUD removed because she does not want anything inside of her and the thought of having an IUD inside of her and the strings being lost (she's had an u/s when this was first diagnosed and showed IUD in the normal position).  She last had sex two days; she states they used withdrawal method. I counseled her that I would recommend leaving the IUD for now given recent intercourse but she would like it removed. I told her she needs to take plan b after leaving and needs to start the OCPs same day, which she is amenable to. She states she has used OCPs before and she has no contraindications to OCPs.   Prior to the procedure being performed, the patient (or guardian) was asked to state their full name, date of birth, and the type of procedure being performed. EGBUS normal. Vaginal vault normal. Cervix normal and no IUD strings seen. Pap smear done and cervix cleaned with betadine. IUD strings easily teased out with blunt removal tool and strings grasped kelly clamps; IUD was intact.   No complications, patient tolerated the procedure well. Lo loestrin sent in   Cornelia Copa MD Attending [facility] [employer] (Faculty Practice) 02/16/2021

## 2021-02-18 LAB — CYTOLOGY - PAP
Chlamydia: NEGATIVE
Comment: NEGATIVE
Comment: NEGATIVE
Comment: NORMAL
Neisseria Gonorrhea: NEGATIVE
Trichomonas: NEGATIVE

## 2021-02-19 ENCOUNTER — Telehealth: Payer: Self-pay | Admitting: Radiology

## 2021-02-19 NOTE — Telephone Encounter (Signed)
Tried calling patient, female answers stating wrong number

## 2021-02-22 ENCOUNTER — Telehealth: Payer: Self-pay | Admitting: Obstetrics and Gynecology

## 2021-02-22 DIAGNOSIS — F4310 Post-traumatic stress disorder, unspecified: Secondary | ICD-10-CM | POA: Diagnosis not present

## 2021-02-22 DIAGNOSIS — F419 Anxiety disorder, unspecified: Secondary | ICD-10-CM | POA: Diagnosis not present

## 2021-02-22 DIAGNOSIS — F329 Major depressive disorder, single episode, unspecified: Secondary | ICD-10-CM | POA: Diagnosis not present

## 2021-02-22 NOTE — Telephone Encounter (Signed)
GYN Telephone Note Patient called and d/w her re: LSIL Pap smear and I recommend colposcopy.  Patient very emotional. I told her that these are very low level abnormal cells and chances of progression to cancer are extremely low but surveillance is needed now and in the future.   Request sent to office to set up appointment  Cornelia Copa MD Attending Center for [facility] (Faculty Practice) 02/22/2021 Time: 1252pm

## 2021-02-25 ENCOUNTER — Telehealth: Payer: Medicaid Other | Admitting: Obstetrics & Gynecology

## 2021-02-25 ENCOUNTER — Other Ambulatory Visit: Payer: Self-pay

## 2021-03-04 DIAGNOSIS — F331 Major depressive disorder, recurrent, moderate: Secondary | ICD-10-CM | POA: Diagnosis not present

## 2021-03-21 DIAGNOSIS — J0100 Acute maxillary sinusitis, unspecified: Secondary | ICD-10-CM | POA: Diagnosis not present

## 2021-03-21 DIAGNOSIS — J302 Other seasonal allergic rhinitis: Secondary | ICD-10-CM | POA: Diagnosis not present

## 2021-03-23 ENCOUNTER — Encounter: Payer: Medicaid Other | Admitting: Obstetrics and Gynecology

## 2021-04-01 DIAGNOSIS — F331 Major depressive disorder, recurrent, moderate: Secondary | ICD-10-CM | POA: Diagnosis not present

## 2021-05-06 DIAGNOSIS — F331 Major depressive disorder, recurrent, moderate: Secondary | ICD-10-CM | POA: Diagnosis not present

## 2021-05-11 ENCOUNTER — Ambulatory Visit (INDEPENDENT_AMBULATORY_CARE_PROVIDER_SITE_OTHER): Payer: Medicaid Other | Admitting: Obstetrics and Gynecology

## 2021-05-11 ENCOUNTER — Encounter: Payer: Self-pay | Admitting: Obstetrics and Gynecology

## 2021-05-11 ENCOUNTER — Other Ambulatory Visit: Payer: Self-pay

## 2021-05-11 VITALS — BP 126/87 | HR 116

## 2021-05-11 DIAGNOSIS — R87612 Low grade squamous intraepithelial lesion on cytologic smear of cervix (LGSIL): Secondary | ICD-10-CM

## 2021-05-11 DIAGNOSIS — Z3041 Encounter for surveillance of contraceptive pills: Secondary | ICD-10-CM

## 2021-05-11 HISTORY — PX: COLPOSCOPY: PRO47

## 2021-05-11 LAB — POCT URINE PREGNANCY: Preg Test, Ur: NEGATIVE

## 2021-05-11 MED ORDER — LO LOESTRIN FE 1 MG-10 MCG / 10 MCG PO TABS: 1.0000 | ORAL_TABLET | Freq: Every day | ORAL | 3 refills | Status: AC

## 2021-05-11 NOTE — Procedures (Signed)
Colposcopy Procedure Note  Pre-operative Diagnosis:  02/16/2021 pap: LSIL. No HPV testing done 08/11/2017 pap: negative  Post-operative Diagnosis: CIN 1  Procedure Details  Last menstrual period: 4 days ago Urine pregnancy test: negative  The risks (including infection, bleeding, pain) and benefits of the procedure were explained to the patient and written informed consent was obtained.  The patient was placed in the dorsal lithotomy position. A Graves was speculum inserted in the vagina, and the cervix was visualized.  Acetic acid staining was done and the cervix was viewed with green filter; lugol's staining with green filter was also done.  Biopsy or endocervical curettage not performed.  There was no bleeding after procedure.   Findings: diffuse AWE changes circumferentially around the cervix  Adequate: Yes  Specimens: None  Condition: Stable  Complications: None  Plan: Repeat pap in one year recommended.  Patient also denies any tobacco use or 2nd hand smoke exposure.  The patient was advised to call for any fever or for prolonged or severe pain or bleeding. She was advised to use OTC analgesics as needed for mild to moderate pain. She was advised to avoid vaginal intercourse for 48 hours or until the bleeding has completely stopped.   Cornelia Copa MD Attending [facility])

## 2021-05-11 NOTE — Progress Notes (Signed)
Obstetrics and Gynecology Visit Return Patient Evaluation  Appointment Date: 05/11/2021  Primary Care Provider: [facility]  OB[facility]N Clinic: [facility]   Chief Complaint: colposcopy  History of Present Illness:  [name] is a 30 y.o. P1 with above CC. Patient came for 3/8 visit b/c she wanted her mirena out b/c of anxiety re: having a contraceptive device in her. Surveillance pap smear was done and came back LSIL; prior pap was negative in 2018.  She was started on lo loestrin which she took for about a week or two but stopped due having a hard time remembering them   Review of Systems: as noted in the History of Present Illness.   Patient Active Problem List   Diagnosis Date Noted  . IUD (intrauterine device) in place 04/11/2018  . Major depressive disorder 09/08/2017   Medications:  [name] had no medications administered during this visit. Current Outpatient Medications  Medication Sig Dispense Refill  . LO LOESTRIN FE 1 MG-10 MCG / 10 MCG tablet Take 1 tablet by mouth daily. 90 tablet 3   No current facility-administered medications for this visit.    Allergies: is allergic to aspirin, ibuprofen, and sulfa antibiotics.  Physical Exam:  BP 126/87   Pulse (!) 116  There is no height or weight on file to calculate BMI. General appearance: Well nourished, well developed female in no acute distress.  Abdomen: diffusely non tender to palpation, non distended, and no masses, hernias Neuro/Psych:  Normal mood and affect.    Pelvic exam:  EGBUS, vaginal vault and cervix: within normal limits with changes c/w CIN 1, no biopsies done   Assessment: pt doing well  Plan:  1. Low grade squamous intraepithelial lesion (LGSIL) on cervical Pap smear Recommend 1 year repeat and importance of follow up d/w her - POCT urine pregnancy  2. Encounter for surveillance of contraceptive pills I reviewed with her re:  contraception options and she states that she doesn't want anything implanted in her. Unfortunately, her BMI is 31 and I told her that if she can get down to 180 that she could do the patch. Patient will try different things to remember and wants to try the pill again. She threw away the packs sent in last time so lo loestrin re sent in   RTC: PRN   Cornelia Copa MD Attending [facility])

## 2021-06-03 DIAGNOSIS — F331 Major depressive disorder, recurrent, moderate: Secondary | ICD-10-CM | POA: Diagnosis not present

## 2021-07-08 DIAGNOSIS — F331 Major depressive disorder, recurrent, moderate: Secondary | ICD-10-CM | POA: Diagnosis not present

## 2021-07-26 DIAGNOSIS — H52222 Regular astigmatism, left eye: Secondary | ICD-10-CM | POA: Diagnosis not present

## 2021-08-05 DIAGNOSIS — F331 Major depressive disorder, recurrent, moderate: Secondary | ICD-10-CM | POA: Diagnosis not present

## 2021-09-23 DIAGNOSIS — F331 Major depressive disorder, recurrent, moderate: Secondary | ICD-10-CM | POA: Diagnosis not present

## 2021-11-11 DIAGNOSIS — Z419 Encounter for procedure for purposes other than remedying health state, unspecified: Secondary | ICD-10-CM | POA: Diagnosis not present

## 2021-11-23 DIAGNOSIS — J020 Streptococcal pharyngitis: Secondary | ICD-10-CM | POA: Diagnosis not present

## 2021-11-23 DIAGNOSIS — R5381 Other malaise: Secondary | ICD-10-CM | POA: Diagnosis not present

## 2021-11-23 DIAGNOSIS — R509 Fever, unspecified: Secondary | ICD-10-CM | POA: Diagnosis not present

## 2021-12-02 DIAGNOSIS — F331 Major depressive disorder, recurrent, moderate: Secondary | ICD-10-CM | POA: Diagnosis not present

## 2021-12-12 DIAGNOSIS — Z419 Encounter for procedure for purposes other than remedying health state, unspecified: Secondary | ICD-10-CM | POA: Diagnosis not present

## 2022-01-12 DIAGNOSIS — Z419 Encounter for procedure for purposes other than remedying health state, unspecified: Secondary | ICD-10-CM | POA: Diagnosis not present

## 2022-02-09 DIAGNOSIS — Z419 Encounter for procedure for purposes other than remedying health state, unspecified: Secondary | ICD-10-CM | POA: Diagnosis not present

## 2022-02-17 DIAGNOSIS — F331 Major depressive disorder, recurrent, moderate: Secondary | ICD-10-CM | POA: Diagnosis not present

## 2022-03-12 DIAGNOSIS — Z419 Encounter for procedure for purposes other than remedying health state, unspecified: Secondary | ICD-10-CM | POA: Diagnosis not present

## 2022-03-21 DIAGNOSIS — J45909 Unspecified asthma, uncomplicated: Secondary | ICD-10-CM | POA: Diagnosis not present

## 2022-03-21 DIAGNOSIS — Z20828 Contact with and (suspected) exposure to other viral communicable diseases: Secondary | ICD-10-CM | POA: Diagnosis not present

## 2022-03-21 DIAGNOSIS — M791 Myalgia, unspecified site: Secondary | ICD-10-CM | POA: Diagnosis not present

## 2022-03-21 DIAGNOSIS — R059 Cough, unspecified: Secondary | ICD-10-CM | POA: Diagnosis not present

## 2022-03-24 DIAGNOSIS — F331 Major depressive disorder, recurrent, moderate: Secondary | ICD-10-CM | POA: Diagnosis not present

## 2022-04-11 DIAGNOSIS — Z419 Encounter for procedure for purposes other than remedying health state, unspecified: Secondary | ICD-10-CM | POA: Diagnosis not present

## 2022-05-05 DIAGNOSIS — F331 Major depressive disorder, recurrent, moderate: Secondary | ICD-10-CM | POA: Diagnosis not present

## 2022-05-12 DIAGNOSIS — Z419 Encounter for procedure for purposes other than remedying health state, unspecified: Secondary | ICD-10-CM | POA: Diagnosis not present

## 2022-06-11 DIAGNOSIS — Z419 Encounter for procedure for purposes other than remedying health state, unspecified: Secondary | ICD-10-CM | POA: Diagnosis not present

## 2022-06-28 DIAGNOSIS — K529 Noninfective gastroenteritis and colitis, unspecified: Secondary | ICD-10-CM | POA: Diagnosis not present

## 2022-06-28 DIAGNOSIS — R110 Nausea: Secondary | ICD-10-CM | POA: Diagnosis not present

## 2022-06-29 DIAGNOSIS — R112 Nausea with vomiting, unspecified: Secondary | ICD-10-CM | POA: Diagnosis not present

## 2022-06-29 DIAGNOSIS — R509 Fever, unspecified: Secondary | ICD-10-CM | POA: Diagnosis not present

## 2022-07-12 DIAGNOSIS — Z419 Encounter for procedure for purposes other than remedying health state, unspecified: Secondary | ICD-10-CM | POA: Diagnosis not present

## 2022-08-03 DIAGNOSIS — Z8759 Personal history of other complications of pregnancy, childbirth and the puerperium: Secondary | ICD-10-CM | POA: Diagnosis not present

## 2022-08-03 DIAGNOSIS — O3680X0 Pregnancy with inconclusive fetal viability, not applicable or unspecified: Secondary | ICD-10-CM | POA: Diagnosis not present

## 2022-08-03 DIAGNOSIS — Z8659 Personal history of other mental and behavioral disorders: Secondary | ICD-10-CM | POA: Diagnosis not present

## 2022-08-03 DIAGNOSIS — Z3A09 9 weeks gestation of pregnancy: Secondary | ICD-10-CM | POA: Diagnosis not present

## 2022-08-03 DIAGNOSIS — Z3481 Encounter for supervision of other normal pregnancy, first trimester: Secondary | ICD-10-CM | POA: Diagnosis not present

## 2022-08-12 DIAGNOSIS — Z419 Encounter for procedure for purposes other than remedying health state, unspecified: Secondary | ICD-10-CM | POA: Diagnosis not present

## 2022-08-15 DIAGNOSIS — R1012 Left upper quadrant pain: Secondary | ICD-10-CM | POA: Diagnosis not present

## 2022-08-15 DIAGNOSIS — R000 Tachycardia, unspecified: Secondary | ICD-10-CM | POA: Diagnosis not present

## 2022-08-15 DIAGNOSIS — Z3A11 11 weeks gestation of pregnancy: Secondary | ICD-10-CM | POA: Diagnosis not present

## 2022-08-15 DIAGNOSIS — R61 Generalized hyperhidrosis: Secondary | ICD-10-CM | POA: Diagnosis not present

## 2022-08-15 DIAGNOSIS — R300 Dysuria: Secondary | ICD-10-CM | POA: Diagnosis not present

## 2022-08-15 DIAGNOSIS — Z743 Need for continuous supervision: Secondary | ICD-10-CM | POA: Diagnosis not present

## 2022-08-30 DIAGNOSIS — Z3482 Encounter for supervision of other normal pregnancy, second trimester: Secondary | ICD-10-CM | POA: Diagnosis not present

## 2022-09-09 DIAGNOSIS — O218 Other vomiting complicating pregnancy: Secondary | ICD-10-CM | POA: Diagnosis not present

## 2022-09-09 DIAGNOSIS — R112 Nausea with vomiting, unspecified: Secondary | ICD-10-CM | POA: Diagnosis not present

## 2022-09-09 DIAGNOSIS — O219 Vomiting of pregnancy, unspecified: Secondary | ICD-10-CM | POA: Diagnosis not present

## 2022-09-09 DIAGNOSIS — Z3A14 14 weeks gestation of pregnancy: Secondary | ICD-10-CM | POA: Diagnosis not present

## 2022-09-09 DIAGNOSIS — O99611 Diseases of the digestive system complicating pregnancy, first trimester: Secondary | ICD-10-CM | POA: Diagnosis not present

## 2022-09-09 DIAGNOSIS — Z743 Need for continuous supervision: Secondary | ICD-10-CM | POA: Diagnosis not present

## 2022-09-11 DIAGNOSIS — Z419 Encounter for procedure for purposes other than remedying health state, unspecified: Secondary | ICD-10-CM | POA: Diagnosis not present

## 2022-09-15 DIAGNOSIS — Z3689 Encounter for other specified antenatal screening: Secondary | ICD-10-CM | POA: Diagnosis not present

## 2022-09-21 DIAGNOSIS — G4489 Other headache syndrome: Secondary | ICD-10-CM | POA: Diagnosis not present

## 2022-09-21 DIAGNOSIS — Z743 Need for continuous supervision: Secondary | ICD-10-CM | POA: Diagnosis not present

## 2022-09-21 DIAGNOSIS — Z3A16 16 weeks gestation of pregnancy: Secondary | ICD-10-CM | POA: Diagnosis not present

## 2022-09-21 DIAGNOSIS — O26892 Other specified pregnancy related conditions, second trimester: Secondary | ICD-10-CM | POA: Diagnosis not present

## 2022-09-21 DIAGNOSIS — R519 Headache, unspecified: Secondary | ICD-10-CM | POA: Diagnosis not present

## 2022-09-21 DIAGNOSIS — R42 Dizziness and giddiness: Secondary | ICD-10-CM | POA: Diagnosis not present

## 2022-09-21 DIAGNOSIS — R209 Unspecified disturbances of skin sensation: Secondary | ICD-10-CM | POA: Diagnosis not present

## 2022-09-21 DIAGNOSIS — R000 Tachycardia, unspecified: Secondary | ICD-10-CM | POA: Diagnosis not present

## 2022-10-12 DIAGNOSIS — Z419 Encounter for procedure for purposes other than remedying health state, unspecified: Secondary | ICD-10-CM | POA: Diagnosis not present

## 2022-10-13 DIAGNOSIS — Z3A19 19 weeks gestation of pregnancy: Secondary | ICD-10-CM | POA: Diagnosis not present

## 2022-10-13 DIAGNOSIS — Z3689 Encounter for other specified antenatal screening: Secondary | ICD-10-CM | POA: Diagnosis not present

## 2022-10-25 DIAGNOSIS — R300 Dysuria: Secondary | ICD-10-CM | POA: Diagnosis not present

## 2022-11-10 DIAGNOSIS — O359XX0 Maternal care for (suspected) fetal abnormality and damage, unspecified, not applicable or unspecified: Secondary | ICD-10-CM | POA: Diagnosis not present

## 2022-11-10 DIAGNOSIS — Z3492 Encounter for supervision of normal pregnancy, unspecified, second trimester: Secondary | ICD-10-CM | POA: Diagnosis not present

## 2022-11-10 DIAGNOSIS — Z3689 Encounter for other specified antenatal screening: Secondary | ICD-10-CM | POA: Diagnosis not present

## 2022-11-10 DIAGNOSIS — Z3A23 23 weeks gestation of pregnancy: Secondary | ICD-10-CM | POA: Diagnosis not present

## 2022-11-10 DIAGNOSIS — L299 Pruritus, unspecified: Secondary | ICD-10-CM | POA: Diagnosis not present

## 2022-11-10 DIAGNOSIS — Z6834 Body mass index (BMI) 34.0-34.9, adult: Secondary | ICD-10-CM | POA: Diagnosis not present

## 2022-11-11 DIAGNOSIS — Z419 Encounter for procedure for purposes other than remedying health state, unspecified: Secondary | ICD-10-CM | POA: Diagnosis not present

## 2022-11-18 DIAGNOSIS — F4310 Post-traumatic stress disorder, unspecified: Secondary | ICD-10-CM | POA: Diagnosis not present

## 2022-11-23 DIAGNOSIS — R059 Cough, unspecified: Secondary | ICD-10-CM | POA: Diagnosis not present

## 2022-11-23 DIAGNOSIS — R0981 Nasal congestion: Secondary | ICD-10-CM | POA: Diagnosis not present

## 2022-11-23 DIAGNOSIS — R070 Pain in throat: Secondary | ICD-10-CM | POA: Diagnosis not present

## 2022-12-08 DIAGNOSIS — Z3689 Encounter for other specified antenatal screening: Secondary | ICD-10-CM | POA: Diagnosis not present

## 2022-12-08 DIAGNOSIS — Z3A27 27 weeks gestation of pregnancy: Secondary | ICD-10-CM | POA: Diagnosis not present

## 2022-12-12 DIAGNOSIS — Z419 Encounter for procedure for purposes other than remedying health state, unspecified: Secondary | ICD-10-CM | POA: Diagnosis not present

## 2022-12-13 DIAGNOSIS — Z3689 Encounter for other specified antenatal screening: Secondary | ICD-10-CM | POA: Diagnosis not present

## 2022-12-27 DIAGNOSIS — R21 Rash and other nonspecific skin eruption: Secondary | ICD-10-CM | POA: Diagnosis not present

## 2022-12-28 DIAGNOSIS — L03317 Cellulitis of buttock: Secondary | ICD-10-CM | POA: Diagnosis not present

## 2022-12-28 DIAGNOSIS — L0231 Cutaneous abscess of buttock: Secondary | ICD-10-CM | POA: Diagnosis not present

## 2022-12-28 DIAGNOSIS — O99713 Diseases of the skin and subcutaneous tissue complicating pregnancy, third trimester: Secondary | ICD-10-CM | POA: Diagnosis not present

## 2022-12-28 DIAGNOSIS — R102 Pelvic and perineal pain: Secondary | ICD-10-CM | POA: Diagnosis not present

## 2022-12-28 DIAGNOSIS — O26893 Other specified pregnancy related conditions, third trimester: Secondary | ICD-10-CM | POA: Diagnosis not present

## 2022-12-28 DIAGNOSIS — Z3A30 30 weeks gestation of pregnancy: Secondary | ICD-10-CM | POA: Diagnosis not present

## 2022-12-29 DIAGNOSIS — L03317 Cellulitis of buttock: Secondary | ICD-10-CM | POA: Diagnosis not present

## 2022-12-29 DIAGNOSIS — O26893 Other specified pregnancy related conditions, third trimester: Secondary | ICD-10-CM | POA: Diagnosis not present

## 2022-12-29 DIAGNOSIS — R102 Pelvic and perineal pain: Secondary | ICD-10-CM | POA: Diagnosis not present

## 2022-12-29 DIAGNOSIS — L0231 Cutaneous abscess of buttock: Secondary | ICD-10-CM | POA: Diagnosis not present

## 2023-01-10 DIAGNOSIS — Z3A32 32 weeks gestation of pregnancy: Secondary | ICD-10-CM | POA: Diagnosis not present

## 2023-01-10 DIAGNOSIS — O26843 Uterine size-date discrepancy, third trimester: Secondary | ICD-10-CM | POA: Diagnosis not present

## 2023-01-10 DIAGNOSIS — O99213 Obesity complicating pregnancy, third trimester: Secondary | ICD-10-CM | POA: Diagnosis not present

## 2023-01-12 DIAGNOSIS — Z419 Encounter for procedure for purposes other than remedying health state, unspecified: Secondary | ICD-10-CM | POA: Diagnosis not present

## 2023-01-19 DIAGNOSIS — F331 Major depressive disorder, recurrent, moderate: Secondary | ICD-10-CM | POA: Diagnosis not present

## 2023-01-31 DIAGNOSIS — Z4800 Encounter for change or removal of nonsurgical wound dressing: Secondary | ICD-10-CM | POA: Diagnosis not present

## 2023-01-31 DIAGNOSIS — Z331 Pregnant state, incidental: Secondary | ICD-10-CM | POA: Diagnosis not present

## 2023-02-08 DIAGNOSIS — O99213 Obesity complicating pregnancy, third trimester: Secondary | ICD-10-CM | POA: Diagnosis not present

## 2023-02-08 DIAGNOSIS — Z3689 Encounter for other specified antenatal screening: Secondary | ICD-10-CM | POA: Diagnosis not present

## 2023-02-08 DIAGNOSIS — Z3A36 36 weeks gestation of pregnancy: Secondary | ICD-10-CM | POA: Diagnosis not present

## 2023-02-08 DIAGNOSIS — O321XX0 Maternal care for breech presentation, not applicable or unspecified: Secondary | ICD-10-CM | POA: Diagnosis not present

## 2023-02-10 DIAGNOSIS — Z419 Encounter for procedure for purposes other than remedying health state, unspecified: Secondary | ICD-10-CM | POA: Diagnosis not present

## 2023-02-14 DIAGNOSIS — Z3A37 37 weeks gestation of pregnancy: Secondary | ICD-10-CM | POA: Diagnosis not present

## 2023-02-14 DIAGNOSIS — Z3689 Encounter for other specified antenatal screening: Secondary | ICD-10-CM | POA: Diagnosis not present

## 2023-02-20 DIAGNOSIS — J324 Chronic pansinusitis: Secondary | ICD-10-CM | POA: Diagnosis not present

## 2023-02-20 DIAGNOSIS — R051 Acute cough: Secondary | ICD-10-CM | POA: Diagnosis not present

## 2023-02-20 DIAGNOSIS — J040 Acute laryngitis: Secondary | ICD-10-CM | POA: Diagnosis not present

## 2023-02-22 DIAGNOSIS — Z3689 Encounter for other specified antenatal screening: Secondary | ICD-10-CM | POA: Diagnosis not present

## 2023-02-22 DIAGNOSIS — Z3A38 38 weeks gestation of pregnancy: Secondary | ICD-10-CM | POA: Diagnosis not present

## 2023-02-22 DIAGNOSIS — O2343 Unspecified infection of urinary tract in pregnancy, third trimester: Secondary | ICD-10-CM | POA: Diagnosis not present

## 2023-02-22 DIAGNOSIS — O471 False labor at or after 37 completed weeks of gestation: Secondary | ICD-10-CM | POA: Diagnosis not present

## 2023-02-22 DIAGNOSIS — I10 Essential (primary) hypertension: Secondary | ICD-10-CM | POA: Diagnosis not present

## 2023-02-22 DIAGNOSIS — Z789 Other specified health status: Secondary | ICD-10-CM | POA: Diagnosis not present

## 2023-02-22 DIAGNOSIS — R000 Tachycardia, unspecified: Secondary | ICD-10-CM | POA: Diagnosis not present

## 2023-02-27 DIAGNOSIS — O26893 Other specified pregnancy related conditions, third trimester: Secondary | ICD-10-CM | POA: Diagnosis not present

## 2023-02-27 DIAGNOSIS — Z743 Need for continuous supervision: Secondary | ICD-10-CM | POA: Diagnosis not present

## 2023-02-27 DIAGNOSIS — O471 False labor at or after 37 completed weeks of gestation: Secondary | ICD-10-CM | POA: Diagnosis not present

## 2023-02-27 DIAGNOSIS — Z3A39 39 weeks gestation of pregnancy: Secondary | ICD-10-CM | POA: Diagnosis not present

## 2023-02-27 DIAGNOSIS — R1030 Lower abdominal pain, unspecified: Secondary | ICD-10-CM | POA: Diagnosis not present

## 2023-02-27 DIAGNOSIS — R000 Tachycardia, unspecified: Secondary | ICD-10-CM | POA: Diagnosis not present

## 2023-02-27 DIAGNOSIS — R1084 Generalized abdominal pain: Secondary | ICD-10-CM | POA: Diagnosis not present

## 2023-02-27 DIAGNOSIS — R102 Pelvic and perineal pain: Secondary | ICD-10-CM | POA: Diagnosis not present

## 2023-03-03 DIAGNOSIS — Z79899 Other long term (current) drug therapy: Secondary | ICD-10-CM | POA: Diagnosis not present

## 2023-03-03 DIAGNOSIS — Z3A40 40 weeks gestation of pregnancy: Secondary | ICD-10-CM | POA: Diagnosis not present

## 2023-03-13 DIAGNOSIS — Z419 Encounter for procedure for purposes other than remedying health state, unspecified: Secondary | ICD-10-CM | POA: Diagnosis not present

## 2023-03-21 DIAGNOSIS — R051 Acute cough: Secondary | ICD-10-CM | POA: Diagnosis not present

## 2023-03-21 DIAGNOSIS — L02212 Cutaneous abscess of back [any part, except buttock]: Secondary | ICD-10-CM | POA: Diagnosis not present

## 2023-03-21 DIAGNOSIS — R0981 Nasal congestion: Secondary | ICD-10-CM | POA: Diagnosis not present

## 2023-04-12 DIAGNOSIS — Z419 Encounter for procedure for purposes other than remedying health state, unspecified: Secondary | ICD-10-CM | POA: Diagnosis not present

## 2023-04-18 DIAGNOSIS — Z124 Encounter for screening for malignant neoplasm of cervix: Secondary | ICD-10-CM | POA: Diagnosis not present

## 2023-04-18 DIAGNOSIS — R87610 Atypical squamous cells of undetermined significance on cytologic smear of cervix (ASC-US): Secondary | ICD-10-CM | POA: Diagnosis not present

## 2023-05-13 DIAGNOSIS — Z419 Encounter for procedure for purposes other than remedying health state, unspecified: Secondary | ICD-10-CM | POA: Diagnosis not present

## 2023-06-12 DIAGNOSIS — Z419 Encounter for procedure for purposes other than remedying health state, unspecified: Secondary | ICD-10-CM | POA: Diagnosis not present

## 2023-07-10 DIAGNOSIS — H5213 Myopia, bilateral: Secondary | ICD-10-CM | POA: Diagnosis not present

## 2023-07-11 DIAGNOSIS — R87810 Cervical high risk human papillomavirus (HPV) DNA test positive: Secondary | ICD-10-CM | POA: Diagnosis not present

## 2023-07-11 DIAGNOSIS — N841 Polyp of cervix uteri: Secondary | ICD-10-CM | POA: Diagnosis not present

## 2023-07-11 DIAGNOSIS — Z202 Contact with and (suspected) exposure to infections with a predominantly sexual mode of transmission: Secondary | ICD-10-CM | POA: Diagnosis not present

## 2023-07-11 DIAGNOSIS — R87610 Atypical squamous cells of undetermined significance on cytologic smear of cervix (ASC-US): Secondary | ICD-10-CM | POA: Diagnosis not present

## 2023-07-11 DIAGNOSIS — N72 Inflammatory disease of cervix uteri: Secondary | ICD-10-CM | POA: Diagnosis not present

## 2023-07-13 DIAGNOSIS — Z419 Encounter for procedure for purposes other than remedying health state, unspecified: Secondary | ICD-10-CM | POA: Diagnosis not present

## 2023-07-20 DIAGNOSIS — F331 Major depressive disorder, recurrent, moderate: Secondary | ICD-10-CM | POA: Diagnosis not present

## 2023-08-13 DIAGNOSIS — Z419 Encounter for procedure for purposes other than remedying health state, unspecified: Secondary | ICD-10-CM | POA: Diagnosis not present

## 2023-09-07 DIAGNOSIS — F331 Major depressive disorder, recurrent, moderate: Secondary | ICD-10-CM | POA: Diagnosis not present

## 2023-09-12 DIAGNOSIS — Z419 Encounter for procedure for purposes other than remedying health state, unspecified: Secondary | ICD-10-CM | POA: Diagnosis not present

## 2023-10-05 DIAGNOSIS — F331 Major depressive disorder, recurrent, moderate: Secondary | ICD-10-CM | POA: Diagnosis not present

## 2023-10-13 DIAGNOSIS — Z419 Encounter for procedure for purposes other than remedying health state, unspecified: Secondary | ICD-10-CM | POA: Diagnosis not present

## 2023-11-12 DIAGNOSIS — Z419 Encounter for procedure for purposes other than remedying health state, unspecified: Secondary | ICD-10-CM | POA: Diagnosis not present

## 2023-12-13 DIAGNOSIS — Z419 Encounter for procedure for purposes other than remedying health state, unspecified: Secondary | ICD-10-CM | POA: Diagnosis not present

## 2024-01-03 DIAGNOSIS — F411 Generalized anxiety disorder: Secondary | ICD-10-CM | POA: Diagnosis not present

## 2024-01-13 DIAGNOSIS — Z419 Encounter for procedure for purposes other than remedying health state, unspecified: Secondary | ICD-10-CM | POA: Diagnosis not present

## 2024-02-10 DIAGNOSIS — Z419 Encounter for procedure for purposes other than remedying health state, unspecified: Secondary | ICD-10-CM | POA: Diagnosis not present

## 2024-03-23 DIAGNOSIS — Z419 Encounter for procedure for purposes other than remedying health state, unspecified: Secondary | ICD-10-CM | POA: Diagnosis not present

## 2024-04-22 DIAGNOSIS — Z419 Encounter for procedure for purposes other than remedying health state, unspecified: Secondary | ICD-10-CM | POA: Diagnosis not present

## 2024-04-24 DIAGNOSIS — R55 Syncope and collapse: Secondary | ICD-10-CM | POA: Diagnosis not present

## 2024-04-24 DIAGNOSIS — R0682 Tachypnea, not elsewhere classified: Secondary | ICD-10-CM | POA: Diagnosis not present

## 2024-04-24 DIAGNOSIS — Z743 Need for continuous supervision: Secondary | ICD-10-CM | POA: Diagnosis not present

## 2024-04-24 DIAGNOSIS — W19XXXA Unspecified fall, initial encounter: Secondary | ICD-10-CM | POA: Diagnosis not present

## 2024-05-23 DIAGNOSIS — Z419 Encounter for procedure for purposes other than remedying health state, unspecified: Secondary | ICD-10-CM | POA: Diagnosis not present

## 2024-06-22 DIAGNOSIS — Z419 Encounter for procedure for purposes other than remedying health state, unspecified: Secondary | ICD-10-CM | POA: Diagnosis not present

## 2024-07-23 DIAGNOSIS — Z419 Encounter for procedure for purposes other than remedying health state, unspecified: Secondary | ICD-10-CM | POA: Diagnosis not present

## 2024-08-23 DIAGNOSIS — Z419 Encounter for procedure for purposes other than remedying health state, unspecified: Secondary | ICD-10-CM | POA: Diagnosis not present

## 2024-09-22 DIAGNOSIS — Z419 Encounter for procedure for purposes other than remedying health state, unspecified: Secondary | ICD-10-CM | POA: Diagnosis not present

## 2024-11-22 DIAGNOSIS — Z419 Encounter for procedure for purposes other than remedying health state, unspecified: Secondary | ICD-10-CM | POA: Diagnosis not present
